# Patient Record
Sex: Female | Born: 1945
Health system: Southern US, Community
[De-identification: ages and names within clinical notes are randomized; demographics above are authoritative.]

## PROBLEM LIST (undated history)

## (undated) DIAGNOSIS — M797 Fibromyalgia: Secondary | ICD-10-CM

## (undated) DIAGNOSIS — I10 Essential (primary) hypertension: Secondary | ICD-10-CM

## (undated) DIAGNOSIS — G8929 Other chronic pain: Secondary | ICD-10-CM

## (undated) DIAGNOSIS — D369 Benign neoplasm, unspecified site: Secondary | ICD-10-CM

## (undated) DIAGNOSIS — R32 Unspecified urinary incontinence: Secondary | ICD-10-CM

## (undated) DIAGNOSIS — M1990 Unspecified osteoarthritis, unspecified site: Secondary | ICD-10-CM

## (undated) DIAGNOSIS — W19XXXA Unspecified fall, initial encounter: Secondary | ICD-10-CM

## (undated) DIAGNOSIS — E119 Type 2 diabetes mellitus without complications: Secondary | ICD-10-CM

## (undated) DIAGNOSIS — H269 Unspecified cataract: Secondary | ICD-10-CM

## (undated) DIAGNOSIS — G629 Polyneuropathy, unspecified: Secondary | ICD-10-CM

## (undated) DIAGNOSIS — H9319 Tinnitus, unspecified ear: Secondary | ICD-10-CM

## (undated) HISTORY — DX: Other chronic pain: G89.29

## (undated) HISTORY — DX: Unspecified osteoarthritis, unspecified site: M19.90

## (undated) HISTORY — DX: Polyneuropathy, unspecified: G62.9

## (undated) HISTORY — DX: Fibromyalgia: M79.7

## (undated) HISTORY — DX: Benign neoplasm, unspecified site: D36.9

## (undated) HISTORY — PX: BUNIONECTOMY: SHX129

## (undated) HISTORY — DX: Tinnitus, unspecified ear: H93.19

## (undated) HISTORY — PX: REPLACEMENT TOTAL KNEE: SUR1224

## (undated) HISTORY — DX: Unspecified fall, initial encounter: W19.XXXA

## (undated) HISTORY — PX: TUBAL LIGATION: SHX77

## (undated) HISTORY — DX: Unspecified cataract: H26.9

## (undated) HISTORY — PX: VAGINAL HYSTERECTOMY: SUR661

## (undated) HISTORY — DX: Unspecified urinary incontinence: R32

## (undated) HISTORY — PX: REDUCTION MAMMAPLASTY: SUR839

## (undated) HISTORY — PX: BREAST REDUCTION SURGERY: SHX8

## (undated) HISTORY — DX: Type 2 diabetes mellitus without complications: E11.9

## (undated) HISTORY — DX: Essential (primary) hypertension: I10

---

## 1998-09-20 ENCOUNTER — Encounter: Payer: Self-pay | Admitting: Obstetrics & Gynecology

## 1998-09-20 ENCOUNTER — Ambulatory Visit (HOSPITAL_COMMUNITY): Admission: RE | Admit: 1998-09-20 | Discharge: 1998-09-20 | Payer: Self-pay | Admitting: Obstetrics & Gynecology

## 1999-08-02 ENCOUNTER — Encounter: Payer: Self-pay | Admitting: Cardiology

## 1999-08-02 ENCOUNTER — Encounter: Admission: RE | Admit: 1999-08-02 | Discharge: 1999-08-02 | Payer: Self-pay | Admitting: Cardiology

## 1999-09-11 ENCOUNTER — Other Ambulatory Visit: Admission: RE | Admit: 1999-09-11 | Discharge: 1999-09-11 | Payer: Self-pay | Admitting: Specialist

## 1999-11-20 ENCOUNTER — Ambulatory Visit (HOSPITAL_BASED_OUTPATIENT_CLINIC_OR_DEPARTMENT_OTHER): Admission: RE | Admit: 1999-11-20 | Discharge: 1999-11-20 | Payer: Self-pay | Admitting: Orthopedic Surgery

## 1999-12-28 ENCOUNTER — Other Ambulatory Visit: Admission: RE | Admit: 1999-12-28 | Discharge: 1999-12-28 | Payer: Self-pay | Admitting: Obstetrics and Gynecology

## 2001-03-06 ENCOUNTER — Ambulatory Visit (HOSPITAL_COMMUNITY): Admission: RE | Admit: 2001-03-06 | Discharge: 2001-03-06 | Payer: Self-pay | Admitting: Obstetrics and Gynecology

## 2001-03-06 ENCOUNTER — Encounter: Payer: Self-pay | Admitting: Obstetrics and Gynecology

## 2001-03-20 ENCOUNTER — Encounter: Admission: RE | Admit: 2001-03-20 | Discharge: 2001-03-20 | Payer: Self-pay | Admitting: Cardiology

## 2001-03-20 ENCOUNTER — Encounter: Payer: Self-pay | Admitting: Cardiology

## 2001-06-14 ENCOUNTER — Emergency Department (HOSPITAL_COMMUNITY): Admission: EM | Admit: 2001-06-14 | Discharge: 2001-06-14 | Payer: Self-pay | Admitting: Emergency Medicine

## 2001-06-14 ENCOUNTER — Encounter: Payer: Self-pay | Admitting: Emergency Medicine

## 2001-07-18 ENCOUNTER — Emergency Department (HOSPITAL_COMMUNITY): Admission: EM | Admit: 2001-07-18 | Discharge: 2001-07-18 | Payer: Self-pay | Admitting: Emergency Medicine

## 2001-07-23 ENCOUNTER — Ambulatory Visit (HOSPITAL_COMMUNITY): Admission: RE | Admit: 2001-07-23 | Discharge: 2001-07-23 | Payer: Self-pay | Admitting: Orthopedic Surgery

## 2001-07-23 ENCOUNTER — Encounter: Payer: Self-pay | Admitting: Orthopedic Surgery

## 2001-09-05 ENCOUNTER — Encounter: Admission: RE | Admit: 2001-09-05 | Discharge: 2001-12-04 | Payer: Self-pay | Admitting: Orthopedic Surgery

## 2001-11-26 ENCOUNTER — Encounter: Payer: Self-pay | Admitting: Orthopedic Surgery

## 2001-12-01 ENCOUNTER — Inpatient Hospital Stay (HOSPITAL_COMMUNITY): Admission: RE | Admit: 2001-12-01 | Discharge: 2001-12-03 | Payer: Self-pay | Admitting: Orthopedic Surgery

## 2001-12-07 ENCOUNTER — Emergency Department (HOSPITAL_COMMUNITY): Admission: EM | Admit: 2001-12-07 | Discharge: 2001-12-07 | Payer: Self-pay | Admitting: *Deleted

## 2002-06-17 ENCOUNTER — Encounter: Admission: RE | Admit: 2002-06-17 | Discharge: 2002-06-17 | Payer: Self-pay | Admitting: Orthopedic Surgery

## 2002-06-17 ENCOUNTER — Encounter: Payer: Self-pay | Admitting: Orthopedic Surgery

## 2002-07-20 ENCOUNTER — Inpatient Hospital Stay (HOSPITAL_COMMUNITY): Admission: RE | Admit: 2002-07-20 | Discharge: 2002-07-23 | Payer: Self-pay | Admitting: Orthopedic Surgery

## 2002-09-03 ENCOUNTER — Encounter: Admission: RE | Admit: 2002-09-03 | Discharge: 2002-10-06 | Payer: Self-pay | Admitting: Orthopedic Surgery

## 2002-12-01 ENCOUNTER — Encounter: Payer: Self-pay | Admitting: *Deleted

## 2002-12-01 ENCOUNTER — Ambulatory Visit (HOSPITAL_COMMUNITY): Admission: RE | Admit: 2002-12-01 | Discharge: 2002-12-01 | Payer: Self-pay | Admitting: *Deleted

## 2002-12-18 ENCOUNTER — Encounter: Payer: Self-pay | Admitting: Obstetrics and Gynecology

## 2002-12-18 ENCOUNTER — Ambulatory Visit (HOSPITAL_COMMUNITY): Admission: RE | Admit: 2002-12-18 | Discharge: 2002-12-18 | Payer: Self-pay | Admitting: Obstetrics and Gynecology

## 2002-12-21 ENCOUNTER — Other Ambulatory Visit: Admission: RE | Admit: 2002-12-21 | Discharge: 2002-12-21 | Payer: Self-pay | Admitting: Obstetrics and Gynecology

## 2004-02-14 ENCOUNTER — Encounter: Admission: RE | Admit: 2004-02-14 | Discharge: 2004-02-14 | Payer: Self-pay | Admitting: Cardiology

## 2004-06-09 ENCOUNTER — Ambulatory Visit (HOSPITAL_COMMUNITY): Admission: RE | Admit: 2004-06-09 | Discharge: 2004-06-09 | Payer: Self-pay | Admitting: Obstetrics and Gynecology

## 2004-07-19 ENCOUNTER — Other Ambulatory Visit: Admission: RE | Admit: 2004-07-19 | Discharge: 2004-07-19 | Payer: Self-pay | Admitting: Obstetrics and Gynecology

## 2004-11-19 ENCOUNTER — Emergency Department (HOSPITAL_COMMUNITY): Admission: EM | Admit: 2004-11-19 | Discharge: 2004-11-19 | Payer: Self-pay | Admitting: Emergency Medicine

## 2004-12-21 ENCOUNTER — Ambulatory Visit: Payer: Self-pay | Admitting: Gastroenterology

## 2005-04-17 ENCOUNTER — Ambulatory Visit (HOSPITAL_COMMUNITY): Admission: RE | Admit: 2005-04-17 | Discharge: 2005-04-17 | Payer: Self-pay | Admitting: Cardiology

## 2005-06-07 ENCOUNTER — Inpatient Hospital Stay (HOSPITAL_BASED_OUTPATIENT_CLINIC_OR_DEPARTMENT_OTHER): Admission: RE | Admit: 2005-06-07 | Discharge: 2005-06-07 | Payer: Self-pay | Admitting: Cardiology

## 2005-06-21 ENCOUNTER — Ambulatory Visit (HOSPITAL_COMMUNITY): Admission: RE | Admit: 2005-06-21 | Discharge: 2005-06-21 | Payer: Self-pay | Admitting: Obstetrics and Gynecology

## 2005-07-12 ENCOUNTER — Encounter: Admission: RE | Admit: 2005-07-12 | Discharge: 2005-07-12 | Payer: Self-pay | Admitting: Obstetrics and Gynecology

## 2005-08-01 ENCOUNTER — Other Ambulatory Visit: Admission: RE | Admit: 2005-08-01 | Discharge: 2005-08-01 | Payer: Self-pay | Admitting: Obstetrics and Gynecology

## 2005-08-09 ENCOUNTER — Encounter: Admission: RE | Admit: 2005-08-09 | Discharge: 2005-08-09 | Payer: Self-pay | Admitting: Obstetrics and Gynecology

## 2006-03-05 ENCOUNTER — Encounter: Admission: RE | Admit: 2006-03-05 | Discharge: 2006-03-05 | Payer: Self-pay | Admitting: Obstetrics and Gynecology

## 2006-04-18 ENCOUNTER — Encounter: Admission: RE | Admit: 2006-04-18 | Discharge: 2006-04-18 | Payer: Self-pay | Admitting: Cardiology

## 2006-06-23 ENCOUNTER — Emergency Department (HOSPITAL_COMMUNITY): Admission: EM | Admit: 2006-06-23 | Discharge: 2006-06-23 | Payer: Self-pay | Admitting: Emergency Medicine

## 2007-03-24 ENCOUNTER — Encounter: Admission: RE | Admit: 2007-03-24 | Discharge: 2007-03-24 | Payer: Self-pay | Admitting: Cardiology

## 2007-09-30 ENCOUNTER — Encounter: Admission: RE | Admit: 2007-09-30 | Discharge: 2007-09-30 | Payer: Self-pay | Admitting: Cardiology

## 2008-05-21 ENCOUNTER — Emergency Department (HOSPITAL_COMMUNITY): Admission: EM | Admit: 2008-05-21 | Discharge: 2008-05-22 | Payer: Self-pay | Admitting: Emergency Medicine

## 2008-10-06 ENCOUNTER — Encounter: Admission: RE | Admit: 2008-10-06 | Discharge: 2008-10-06 | Payer: Self-pay | Admitting: Cardiology

## 2009-07-29 ENCOUNTER — Encounter: Admission: RE | Admit: 2009-07-29 | Discharge: 2009-07-29 | Payer: Self-pay | Admitting: Cardiology

## 2009-08-10 ENCOUNTER — Ambulatory Visit (HOSPITAL_COMMUNITY): Admission: RE | Admit: 2009-08-10 | Discharge: 2009-08-10 | Payer: Self-pay | Admitting: Cardiology

## 2009-08-10 ENCOUNTER — Ambulatory Visit: Payer: Self-pay | Admitting: Vascular Surgery

## 2009-08-10 ENCOUNTER — Encounter (INDEPENDENT_AMBULATORY_CARE_PROVIDER_SITE_OTHER): Payer: Self-pay | Admitting: Cardiology

## 2010-03-29 ENCOUNTER — Encounter: Admission: RE | Admit: 2010-03-29 | Discharge: 2010-03-29 | Payer: Self-pay | Admitting: Cardiology

## 2010-06-03 ENCOUNTER — Encounter: Payer: Self-pay | Admitting: Cardiology

## 2010-06-03 ENCOUNTER — Encounter: Payer: Self-pay | Admitting: Obstetrics and Gynecology

## 2010-06-04 ENCOUNTER — Encounter: Payer: Self-pay | Admitting: Cardiology

## 2010-09-29 NOTE — Op Note (Signed)
NAME:  Carvin, Jacquelinne O                           ACCOUNT NO.:  [id number]   MEDICAL RECORD NO.:  [id number]                   PATIENT TYPE:  INP   LOCATION:  5003                                 FACILITY:  [facility]   PHYSICIAN:  John L. Rendall III, M.D.           DATE OF BIRTH:  [birth year]   DATE OF PROCEDURE:  07/20/2002  DATE OF DISCHARGE:                                 OPERATIVE REPORT   PREOPERATIVE DIAGNOSIS:  Osteoarthritis left knee.   PROCEDURE:  Left LCS total knee replacement.   POSTOPERATIVE DIAGNOSIS:  Osteoarthritis left knee.   SURGEON:  John L. Rendall, M.D.   ASSISTANT:  Madilyn Fireman, P.A.-C.   ANESTHESIA:  General.   PATHOLOGY:  The patient has osteoarthritis that has failed conservative  management including arthroscopic debridement.  She has a severe defect  medial femoral condyle and patellofemoral articulation.   DESCRIPTION OF PROCEDURE:  Under general anesthesia, the left leg was  prepared with Duraprep and draped as a sterile field. The leg is wrapped out  with an Esmarch and the tourniquet is used at 350 mm.  A midline incision is  made. The patella is everted.  The femur is sized to the standard plus using  the first tibial guide.  A proximal tibial resection is carried out. Using  the first femoral guide an intercondylar drill hole is placed. Using the  second guide, flare of the distal femur is resected anteriorly and  posteriorly with a balanced 15 mm flexion gap.  Intermedullary guide is then  used and a balanced extension gap at 15 mm is obtained.  Recessing guide is  then used.  Lamina spreader is then used and remnants of the cruciates and  the menisci are resected.  The KB is then sized to the #3.  Center peg hole  with keel is placed.  Trial of #3 tibia 15 bearing and standard plus femur  reveals excellent fit and alignment, but slight laxity.  17.5 bearing is  then inserted and better fit and stability are obtained. Patella is  osteotomized.   Three peg hole patella is inserted for trial and the knee has  excellent range of motion and stability.  Permanent components are then  obtained while bony surfaces are prepared with pulse irrigation. A  synovectomy is carried out. All components are cemented in place.  Once  cement has hardened, excess is removed and the tourniquet is let down at 58  minutes. The knee is then cauterized for bleeding vessels. A medium Hemovac  drain is inserted. It is then closed in layers with #1 Tycron, 0 and 2-0  Vicryl, and skin clips.  Operative time approximately 1 hour 20 minutes. The  patient tolerated the procedure well and returned to the recovery room in  good condition.  John L. Dorothyann Gibbs, M.D.    Renato Gails  D:  07/20/2002  T:  07/20/2002  Job:  [id number]

## 2010-09-29 NOTE — H&P (Signed)
NAME:  Michelle Villarreal, Michelle Villarreal                           ACCOUNT NO.:  [id number]   MEDICAL RECORD NO.:  [id number]                   PATIENT TYPE:  INP   LOCATION:                                       FACILITY:  [facility]   PHYSICIAN:  John L. Rendall, M.D.               DATE OF BIRTH:  [birth year]   DATE OF ADMISSION:  07/20/2002  DATE OF DISCHARGE:                                HISTORY & PHYSICAL   CHIEF COMPLAINT:  Left knee pain.   HISTORY OF PRESENT ILLNESS:  The patient is a 65 year old black female with  a history of bilateral knee pain with chondromalacia.  The patient had a  right total knee arthroplasty in July, 2003 initially with excellent results  but over the last several months since she has returned to work she has been  having a significant amount of discomfort in her right total knee with any  type of weight bearing activity.  The patient has also been having  progressive worsening of her left knee pain.  She describes it as a deep  burning aching sensation in the knees with weight bearing with sharp  shooting pains with awkward movements.  She does have an aching sensation  that does radiate down the legs in combination with her neuropathy in the  glove pattern fashion of bilateral lower extremities.  She does note some  swelling in the knee.  She has had some locking and giving out of the knee.  She does notice some popping and grinding.  She does have significant night  pain.  She is currently using a cane to assist in ambulation.   ALLERGIES:  ULTRAM, drug intolerances, all p.o. and IV pain medicines  causing itching.   The patient did have an unusual reaction to the Arixtra seven days  postoperatively.  She had some superficial bleeding from varicosities in her  lower extremities and this was corrected shortly after discontinuing the  Arixtra.   MEDICATIONS:  1. Neurontin 300 mg p.o. b.i.d.  2. Vitamin E 1,200 units daily.  3. Vitamin C 500 mg daily.  4. Darvocet  p.r.n.  5. Phenergan 25 mg p.o. every four hours to six hours p.r.n.  6. Benadryl 50 mg p.o. every six hours p.r.n.  7. Fosamax 70 mg p.o. every week on Fridays.   PAST MEDICAL HISTORY:  1. Recurrent and chronic bronchitis.  2. 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  3. History of lower extremity deep venous thrombosis.  4. Hiatal hernia.   PAST SURGICAL HISTORY:  1. Venous stripping in bilateral lower extremities.  2. Bilateral bunionectomies.  3. Bilateral total knee arthroscopies.  4. Hysterectomy in 1982.  5. Bilateral breast reduction in 1985.  6. Exploratory laparotomy with ovariectomy in 1989.  7. Bilateral knee arthroscopies in 2001 and 2003.  8. Right total knee arthroplasty.   Patient indicates the only complications  that she has had with any of the  above mentioned was with the total knee and Arixtra with superficial  bleeding in the lower extremity which corrected itself briskly after  discontinuing the Arixtra.   SOCIAL HISTORY:  The patient is a healthy appearing, well-developed 55-year-  old black female.  Denies any history of smoking.  She does have occasional  alcoholic beverage.  She is single, has one grown child.  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   PRIMARY CARE PHYSICIAN:  Dr. Donia Guiles, [phone].   FAMILY HISTORY:  Mother is deceased from ovarian cancer, father is deceased  from peritonitis due to ruptured appendix.  Patient has one brother with a  history of diabetes and one sister with obesity, hypertension, and seizures.   REVIEW OF SYSTEMS:  Positive for current upper respiratory cold for which  she indicates she has no fevers or chills, shortness of breath.  The patient  does have lower partial dentures.  She does wear glasses.  She does have  occasional problems with nausea and itching related to the pain medicine she  is now taking because of her knees.  She does  have the peripheral neuropathy  in a glove pattern type of lower extremities with burning.   PHYSICAL EXAMINATION:  VITALS:  Blood pressure is 138/90, respirations 16,  pulse is 76 and regular, weight is 213 pounds, height is 5 feet 11.  GENERAL:  This is a healthy appearing well-developed, large black female.  She ambulates with a moderate limp.  She appears to be in obvious discomfort  with ambulation.  She does have a cane in her right hand.  She is able to  get herself on and off of the exam table without difficulty.  HEENT:  Head was normocephalic, atraumatic, nontender over maxillary or  frontal sinuses.  Pupils:  Equal, round, and reactive, accommodating  delayed, extraocular movements intact.  Sclerae is nonicteric.  Conjunctivae  is pink and moist.  External ears were without deformities, canals patent,  TM's pearly gray and intact.  Gross hearing is intact.  Oral buccal mucosa  was pink and moist and without lesions.  Dentition was in fair repair,  partial plate in place.  Patient was able to swallow without any difficulty.  NECK:  Supple, no palpable lymphadenopathy.  Thyroid gland region is  nontender.  Patient has full range of motion of her cervical spine without  any difficulty or tenderness.  HEART:  Regular rate and rhythm, S1 and S2 were auscultated, no murmurs,  rubs or gallops noted.  LUNGS:  Clear and equal bilaterally.  No wheezes, rales, rhonchi or rubs  noted.  ABDOMEN:  Soft, nontender, bowel sounds were normoactive throughout.  She  had no hepatosplenomegaly.  The CVA region was nontender to percussion.  EXTREMITIES:  Upper extremities were symmetrically sized and shaped.  She  had excellent range of motion of both her shoulders, elbows and wrists.  Motor strength was 5/5.  LOWER EXTREMITIES:  Right and left hip had full range of motion without any  mechanical symptoms or deficits.  Right knee was a well-healed, midline surgical incision, it was small and along  the femoral component aspect.  It  was very tender throughout.  She had full extension and flexion back to  about 105 degrees but it was uncomfortable in extremes.  There was no  significant instability.  The calf was nontender.  The left  knee was round,  full appearing, had well-healed arthroscopic surgical ports, no palpable  effusion.  She was tender along the medial and lateral joint lines.  She had  a moderate amount of crepitus on the patella with full extension and flexion  back to about 105 degrees, no significant instability medially or laterally.  The calf was nontender.  Ankles were symmetric both of the dorsi and plantar  flexion.  PERIPHERAL VASCULATURE:  Carotid pulses were 2+, no bruits.  Radial pulses  were 2+.  Dorsalis pedis and posterior tibial pulses were 1+.  She had trace  lower extremity edema which was nonpitting.  No significant varicosities or  venous stasis changes.  NEURO:  The patient was conscious, alert, and appropriate, held decent  conversation with examiner.  Cranial nerves II through XII were grossly  intact.  Patient had light touch sensation from head to toe.  Tendon  reflexes were symmetrical right to left.  Patient had no gross neurologic  defects.   IMPRESSION:  1. End-stage osteoarthritis, left knee.  2. Painful right total knee arthroplasty.  3. History of frequent bronchitis.  4. Hiatal hernia.  5. History of peripheral neuropathy.  6. History of lower extremity deep venous thrombosis.   PLAN:  The patient will be admitted to [facility] on July 20, 2002  under the care of Dr. Jonny Ruiz L. Rendall, M.D.  The patient will undergo all  routine laboratory and tests prior to having a left total knee arthroplasty  performed.     Jamelle Rushing, P.A.                      John L. Priscille Kluver, M.D.    RWK/MEDQ  D:  07/09/2002  T:  07/09/2002  Job:  [id number]

## 2010-09-29 NOTE — Op Note (Signed)
[facility]  Patient:    Michelle Villarreal, Michelle Villarreal Visit Number: [id number] MRN: [id number]          Service Type: SUR Location: 5000 5011 [id number] Attending Physician:  Carolan Shiver Ii Proc. Date: 12/01/01 Admit Date:  12/01/2001 Discharge Date: 12/03/2001                             Operative Report  PREOPERATIVE DIAGNOSIS:  Osteoarthritis right knee.  POSTOPERATIVE DIAGNOSIS:  Osteoarthritis right knee.  PROCEDURE PERFORMED:  Right LCS total knee replacement.  SURGEON:  John L. Dorothyann Gibbs, M.D.  ASSISTANTJerolyn Shin. Tresa Res, M.D.  ANESTHESIA:  General.  INDICATION:  The patient has had within the last four months, knee arthroscopy at which point she was found to have peeling highland cartilage over much of the articular surface of her knee.  These findings were confirmed again today with an erosive aggressive osteoarthritis with significant scarring of the suprapatellar pouch and synovium.  DESCRIPTION OF PROCEDURE:  Under general anesthesia, the right leg is prepared with Duraprep and draped as a sterile field.  It was wrapped out with an Esmarch and a sterile tourniquet is used at 350 mmHg.  A midline incision is made.  The patella is everted.  The femur is sized to a standard plus.  Using the first guide, the proximal tibial resection is carried out.  The PCL is not preserved.  Using the first femoral guide, an intercondylar drill hole was placed.  Using the second guide, the anterior and posterior flare of the distal femur are resected, but it is necessary to balance the flexion gap and in so doing, a complete medial stripping of the periosteal sleeve around the medial tibia was required, and this balanced the flexion gap at 17.5 in spite of a relatively small proximal tibial resection.  At this point, the intermedullary guide is used and the distal femoral cut is made where a balanced 17.5 extension gap is created.  A recessing guide is  then used.  A laminar spreader is then inserted and remnants of the cruciate and menisci are resected along with peeling of the spurs off the back of the femoral condyle medially and laterally.  Once this was completed, the proximal tibia is exposed.  It is sized to a #4.  Center peg holes with fins is made.  Trial reduction of a #4 tibia, 17.5 barring, standard plus femur reveals excellent fit, alignment, and stability.  The patella is osteotomized and a three peg hole trial patella is inserted and again excellent fit alignment and stability.  Permanent components were then obtained while bony surfaces are prepared with pulse irrigation.  Drill holes were placed in the medial tibia where there was some sclerotic bone.  At this point, having irrigated with antibiotic solution, the tibia is cemented in place.  Finger pressurizing the center peg hole.  Attention was then turned to the femur with plastic barring in place, the femoral component is inserted.  The knee is then brought into extension, but allowed to rest in about a 30 degree flexed position while the patella is clamped and done.  Once the cement is hardened, excess cement is removed.  The tourniquet is let down at 50 minutes.  Excess cement is then removed and multiple small vessels are cauterized.  The knee is then closed in layers over a medium Hemovac drain.  It is closed with #1 Tycron,  0, and 2-0 Vicryl, and skin clips.  Operative time approximately 1 hour and 10 minutes. The patient tolerated the procedure well and returned to the recovery room in good condition. Attending Physician:  Carolan Shiver Ii DD:  12/01/01 TD:  12/04/01 Job: [id number]

## 2010-09-29 NOTE — Discharge Summary (Signed)
NAME:  Michelle Villarreal, Michelle Villarreal                           ACCOUNT NO.:  [id number]   MEDICAL RECORD NO.:  [id number]                   PATIENT TYPE:  INP   LOCATION:  5003                                 FACILITY:  [facility]   PHYSICIAN:  John L. Rendall, M.D.               DATE OF BIRTH:  May 10, 1946   DATE OF ADMISSION:  07/20/2002  DATE OF DISCHARGE:  07/23/2002                                 DISCHARGE SUMMARY   ADMITTING DIAGNOSES:  1. End-stage osteoarthritis, left knee.  2. Painful right total knee arthroplasty.  3. History of frequent bronchitis.  4. Hiatal hernia.  5. History of peripheral neuropathy.  6. History of lower extremity deep vein thrombosis.   DISCHARGE DIAGNOSES:  1. Status post left total knee arthroplasty.  2. Painful right total knee arthroplasty.  3. History of frequent bronchitis.  4. Hiatal hernia.  5. History of peripheral neuropathy.  6. History of lower extremity deep vein thrombosis.   HISTORY OF PRESENT ILLNESS:  A 65 year old black female with a history of  bilateral knee pain with chondromalacia.  She had a total knee arthroplasty  in July of 2003 which she initially had excellent results with.  However,  over the last several months since returning to work she has had a  significant amount of discomfort in her right total knee with any  weightbearing activity.  She has also been experiencing progressively  worsening left knee pain.  Her pain is described as deep burning and aching  sensation in the knees with weightbearing with sharp shooting pains with  awkward movements.  The aching sensation does radiate down her leg and in  combination with her neuropathy in a glove pattern fashion of bilateral  lower extremities.  Her left knee does lock and give away and she notices  popping and guarding.  The pain is significant at night.  She uses a cane to  ambulate.  The patient admitted to [facility] on July 20, 2002, to  undergo left TKA by Dr.  Priscille Kluver.   ALLERGIES:  ULTRAM.  Drug intolerances, ALL P.Villarreal. AND IV PAIN MEDS cause  itching.  __________ caused some superficial bleeding from varicosities in  her lower extremities during her last hospital stay.   MEDICATIONS:  1. Neurontin 300 mg p.Villarreal. b.i.d.  2. Vitamin E 1200 units daily.  3. Vitamin C 500 mg daily.  4. Darvocet p.r.n.  5. Phenergan 25 mg p.Villarreal. every 4-6 hours p.r.n.  6. Benadryl 50 mg p.Villarreal. q.6h. p.r.n.  7. Fosamax 7 mg p.Villarreal. every week on Fridays.   SURGICAL PROCEDURE:  The patient was taken on July 20, 2002, to OR by Dr.  Erasmo Leventhal, assisted by Rexene Edison, P.A.C.  Under general anesthesia left  total knee replacement was done.  Operative time was approximately one hour  and 20 minutes.  The patient tolerated the procedure well and returned to  recovery in good condition.   CONSULTATIONS:  The following consults were obtained while the patient was  hospitalized:  PT, IT, pharmacy and case management.   HOSPITAL COURSE:  The patient remained stable in good condition following  surgery.  She remained afebrile throughout her stay.  She did develop  postoperative anemia secondary to surgery.  She received two units of packed  red blood cells and her H&H on discharge was 10.4/30.3.  She had no symptoms  of anemia.  On postoperative day three she developed some swelling and calf  pain in her left lower leg.  Doppler was obtained to rule out DVT and was  negative for DVT, Baker's cyst or SVT.  The patient did well with physical  therapy throughout her stay and was discharged home with [facility] for home  PT.   LABS:  Routine labs on admission CBC 4.0, hemoglobin 13.2, hematocrit 38.4,  platelets 150.  Next, coags on admission PT 13.2, INR 0.9, PTT 29.  Next,  routine chemistries on admission, sodium 139, potassium 4.1, chloride 106,  bicarb 29, glucose 86, BUN 5, creatinine 2.5.  Next, urinalysis on admission  was negative.  Preoperative chest x-ray shows no active  disease.   DISCHARGE MEDICATIONS:  The patient was placed on the following meds:  1. Coumadin 7.5 mg p.Villarreal. daily then per [place].  2. Percocet 5 mg 1-2 tabs by mouth q.4-6h. as needed for pain.  3. Atarax 25 mg one tab by mouth q.8h. as needed for itching.  4. Oxycodone IR 50 mg one tab p.Villarreal. q.4-6h. as needed for breakthrough pain.  5. Neurontin 300 mg p.Villarreal. b.i.d.  6. Vitamin E 1200 units q.d.  7. Vitamin C 500 mg daily.  8. Phenergan 25 mg p.Villarreal. q.4-6h. p.r.n.  9. Fosamax 70 mg p.Villarreal. q week on Fridays.   ACTIVITY:  The patient is on weightbearing as tolerated with a walker.  Physical therapy with Genevieve Norlander.   DIET:  No restrictions.   CONDITION ON DISCHARGE:  The patient was in good stable condition upon  discharge.   FOLLOWUP:  The patient was to follow up with Dr. Priscille Kluver approximately 10  days after discharge.  She should call office for appointment at [phone]  for followup and staple removal.   SPECIAL CARE:  The patient is to have her Coumadin monitored and dosed by  [place].  She is to use her CPM 0-90 six to eight hours a day and increase  by 10 degrees daily.   WOUND CARE:  1. Keep wound clean and dry.  2. The patient is to call Dr. Priscille Kluver if she develops any signs of     infection:  Fever greater than 101.5, chills,     redness, swelling, foul smelling drainage or pain uncontrolled with pain     medicines.  3. The patient may shower after two days if no drainage from the wound.  4. She should change her dressing daily.     Richardean Canal, P.A.                       John L. Priscille Kluver, M.D.    GC/MEDQ  D:  09/22/2002  T:  09/23/2002  Job:  [id number]   cc:   Jonny Ruiz L. Rendall III, M.D.  [address] [place]  [place] [address]  Fax: [phone]

## 2010-09-29 NOTE — Op Note (Signed)
[place]  Patient:    Michelle Villarreal, Michelle Villarreal                        MRN: [id number] Proc. Date: 11/20/99 Adm. Date:  [id number] Attending:  Alinda Deem                           Operative Report  PREOPERATIVE DIAGNOSIS:  Cartilage tears of both knees.  POSTOPERATIVE DIAGNOSIS:  On the right, the patient had partial medial and lateral meniscal tears, area of grade 4 chondromalacia of the medial femoral condyle over a long strip with large cartilaginous loose bodies.  On the medial side, the strip was 1 x 2 cm and it was a full thickness cartilage with delamination.  On the lateral side, she had chondromalacia of the lateral tibial condyle and a partial lateral meniscal tear as well.  The chondromalacia of the lateral femoral condyle was grade 3 to the patella, grade 3 to the trochlea and grade 2 of the left knee.  She had chondromalacia of the patella, grade 3 lateral tibial condyle grade 3 and a lateral meniscal tear.  PROCEDURE:  On the right, the patient had a partial arthroscopic medial and lateral meniscectomy with removal of large cartilaginous loose bodies, debridement of chondromalacia of the medial and lateral femoral condyle, as well as the patella.  On the left side, she had a partial lateral meniscectomy, and debridement of chondromalacia of the lateral tibial condyle and patella.  SURGEON:  Alinda Deem, M.D.  FIRST ASSISTANT:  Dorthula Matas, P.A.-C.  ANESTHESIA:  General LMA.  ESTIMATED BLOOD LOSS:  Minimal.  FLUID REPLACEMENT:  A liter of crystalloid.  DRAINS PLACED:  None.  TOURNIQUET TIME:  None.  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  On the right side, she has had persistent effusions over the last couple  of weeks, and has failed conservative treatment with anti-inflammatory medicines, physical therapy, Cortisone injections, and most recently, _____ shot. She has reaccumulated fluid each time and is having a great deal of difficulty ambulating and doing her job as a Engineer, civil (consulting).  Because of this, she desires arthroscopic evaluation of the right and left knee.  The left knee has a similar problem, but to a much lesser degree, but she definitely has some lateral joint line pain consistent with a cartilage tear.  DESCRIPTION OF PROCEDURE:  The patient was identified by arm band and taken to the operating room at [facility].  Appropriate anesthetic monitors were attached and general LMA anesthesia induced with the patient in the supine position.  Lateral post were applied to both sides of the table and both lower extremities prepped and draped in the usual sterile fashion from the ankles to the mid thigh.  Beginning on the right side, the inferomedial and inferolateral parapatellar regions were infiltrated with 1 cc to 2 cc of 0.5% Marcaine and epinephrine solution, allowing introduction of the arthroscope to the lateral portal, and the outflow through the medial portal. The patient had a 4+ joint effusion with normal appearing joint fluid with distal articular cartilage noted coming out the outflow.  Diagnostic arthroscopy revealed chondromalacia of the patella and trochlea, grade 3  which was debrided.  Moving to the medial compartment, the medial meniscus had longitudinal tears which were debrided and a long 1 x 2 cm strip of grade 4 cartilage loss where it had delaminated almost off of the bone.  This had flap tears associated with it, and these were debrided back to stable margins. The ACL and the PCL were intact.  On the lateral side, extensive fraying of the lateral meniscus was noted as well as a longitudinal split tear, and this was also debrided with the 4.2 mm barracuda sucker  shaver, as well as the basket biter.  The lateral femoral condyle had some grade 3 flap tears which also required debridement.  Moving back up in the patch, we identified two large cartilaginous loose bodies which were removed through the inferomedial portal which had to be enlarged to a centimeter in size to get them out.  The knee was then continuously washed with normal saline solution.  The arthroscopic instruments removed.  The medial portal closed with a single 4-0 nylon horizontal mattress suture.  We then directed our attention to the left knee which had similar portals made after infiltration with a Marcaine and epinephrine solution.  Diagnostic arthroscopy revealed grade 3 chondromalacia of the patellar apex which was also debrided.  The trochlea appeared to be in good condition as did the articular meniscal cartilages medially and the cruciate ligaments.  On the lateral side, the patient had a pair of big tears of the lateral meniscus which were debrided back to a stable margin.  Grade 2 chondromalacia of the lateral tibial condyle was identified, debrided back as well to stable margins over a 1 x 1 cm area.  The knee was washed out with normal saline solution as we had on the other side.  The gutters were cleared.  The scope was taken medial and lateral of the PCL, removing small bits of articular cartilage found in the joint fluid.  This knee was likewise washed out with normal saline solution.  The arthroscopic instruments removed and another dressing of Xeroform followed by a dressing, sponges, Webril, and an Ace wrap applied to the left knee, and also to the right knee.  The cartilage damage on the left is mild.  On the right side, moderate to severe.  The patient was then awakened and taken to the recovery room without difficulty after instilling 10 cc of Marcaine and epinephrine solution into each knee. DD:  11/20/99 TD:  11/20/99 Job: [id number]

## 2010-09-29 NOTE — Cardiovascular Report (Signed)
NAME:  Michelle Villarreal, Michelle Villarreal                 ACCOUNT NO.:  [id number]   MEDICAL RECORD NO.:  [id number]          PATIENT TYPE:  OIB   LOCATION:  1962                         FACILITY:  [facility]   PHYSICIAN:  Mohan N. Sharyn Lull, M.D. DATE OF BIRTH:  1946-02-17   DATE OF PROCEDURE:  06/07/2005  DATE OF DISCHARGE:  06/07/2005                              CARDIAC CATHETERIZATION   PROCEDURE:  1.  Left cardiac catheterization.  2.  __________ coronary angiograph.  3.  Left ventriculography via right groin using Judkins technique.   INDICATIONS FOR PROCEDURE:  Ms. Belleau is a 65 year old black female with past  medical history significant for hypertension, morbid obesity, degenerative  joint disease.  Complains of vague left-sided chest pain grade 6/10  localized, associated with occasional palpitation off and on.  Patient  states chest pain relieved with Tylenol XL, but makes her feel weak and  lethargic.  Patient also gives history of exertional dyspnea with minimal  exertion.  Denies any PND, orthopnea, leg swelling.  Patient underwent  Persantine Myoview on April 17, 2005 which showed mild anterior wall  ischemia with EF of 61%.  Patient is referred for left catheterization.   PAST MEDICAL HISTORY:  As above.   PAST SURGICAL HISTORY:  1.  She had bilateral knee replacement many years ago.  2.  Had left leg vein stripping many years ago.  3.  Had bilateral breast reduction surgery in 1984.  4.  Hysterectomy in 1982.  5.  Exploratory laparotomy in 1989.  6.  Right oophorectomy many years ago.   SOCIAL HISTORY:  She is single, has one child.  No history of smoking.  Drinks beer and wine occasionally socially.  She worked as Astronomer.  Retired in  2003.   FAMILY HISTORY:  Father died of ruptured appendix at the age of 22.  Mother  died of ovarian cancer.  One brother had cancer.  One sister had  hypertension, seizure disorder and she is obese.   HOME MEDICATIONS:  1.  Toprol XL 25 mg p.o. daily  which she recently stopped.  2.  Baby aspirin 81 mg daily.  3.  Nitrostat sublingual p.r.n.  4.  Ativan 1 mg p.o. daily.  5.  Flexeril 10 mg p.r.n.  6.  Darvocet-N 100 p.r.n.  7.  Calcium.  8.  Vitamin B.  9.  Antioxidants.   PHYSICAL EXAMINATION:  GENERAL:  She was alert and oriented x3.  No acute  distress.  VITAL SIGNS:  Blood pressure was 164/96, pulse was 78, regular.  HEENT:  Conjunctiva was pink.  NECK:  Supple.  No JVD, no bruit.  LUNGS:  Clear to auscultation without rhonchi or rales.  CARDIOVASCULAR:  S1, S2 normal.  There was soft S4.  ABDOMEN:  Soft.  Bowel sounds were present.  Nontender.  EXTREMITIES:  There was no clubbing, cyanosis, or edema.   IMPRESSION:  Recurrent chest pain, mildly positive Persantine Myoview, rule  out __________ hypertension, morbid obesity, degenerative joint disease.  Discussed with the patient regarding left catheterization, its risks and  benefits, i.e., death, stroke, need for emergency  CABG, local vascular  complications, etc., and consented for the procedure.   PROCEDURE:  After obtaining the informed consent patient was brought to the  catheterization laboratory and was placed on fluoroscopy table.  Right groin  was prepped and draped in the usual fashion.  2% Xylocaine was used for  local anesthesia in the right groin.  With help of thin-wall needle 4-French  arterial sheath was placed.  Sheath was aspirated and flushed.  Next, 4-  French left Judkins catheter was advanced over the wire under fluoroscopic  guidance to the ascending aorta.  Wire was pulled out.  The catheter was  aspirated and connected to the manifold.  Catheter was further advanced and  engaged into left coronary ostium.  Multiple views of the left system were  taken.  Next, the catheter was disengaged and was pulled out over the wire  and was replaced with 4-French right Judkins catheter which was advanced  over the wire under fluoroscopic guidance up to the  ascending aorta.  Wire  was pulled out.  The catheter was aspirated and connected to the manifold.  Catheter was further advanced and engaged into right coronary ostium.  Multiple views of the right system were taken.  Next, the catheter was  disengaged and was pulled out over the wire and was replaced with 4-French  pigtail catheter which was advanced over the wire under fluoroscopic  guidance up to the ascending aorta.  Wire was pulled out.  The catheter was  aspirated and connected to the manifold.  Catheter was further advanced  across the aortic valve into the LV.  LV pressures were recorded.  Next, LV  graphy was done in 30 degree RAO position.  Post angiographic pressures were  recorded from LV and then pullback pressures were recorded from the aorta.  There was no gradient across the aortic valve.  Next, the pigtail catheter  was pulled out over the wire.  Sheaths were aspirated and flushed.   FINDINGS:  LV showed good LV systolic function, EF of 50-55%.  Left main was  short which was patent.  LAD has 15-20% mid __________ stenosis.  Diagonal 1  was small which was patent.  Diagonal 2 was very small which has 10-20%  ostial stenosis.  Ramus was very small which was patent.  Left circumflex  has 20-25% mid stenosis.  Obtuse marginal one was moderate size which was  patent.  Obtuse marginal 2 and 3 were small which were patent.  RCA was  patent.  Patient tolerated procedure well.  There are no complications.  Patient was transferred to recovery room in stable condition.           ______________________________  Eduardo Osier Sharyn Lull, M.D.     M[facility]/MEDQ  D:  06/07/2005  T:  06/07/2005  Job:  [id number]   cc:   Osvaldo Shipper. Spruill, M.D.  Fax: [phone]   Cath Lab

## 2010-09-29 NOTE — H&P (Signed)
. [facility]  Patient:    Michelle Villarreal, Michelle Villarreal Visit Number: [id number] MRN: [id number]          Service Type: Attending:  Carlisle Beers. Dorothyann Gibbs, M.D. Dictated by:   Jamelle Rushing, P.A. Adm. Date:  12/01/01                           History and Physical  DATE OF BIRTH:  23-Mar-1946  CHIEF COMPLAINT:  Bilateral knee pain.  HISTORY OF PRESENT ILLNESS:  The patient is a 65 year old black female with a history of bilateral knee chondromalacia with arthroscopic procedures done approximately 25 years ago.  The patient has had good improvement of her discomfort up until around 2001 when the patient noted some significant increased warmth in her knees while doing walking for exercise.  The patient decreased the amount of activity she performed.  Eventually, she had significant return of pain.  She also had another arthroscopic evaluation with debridement with a short-term improvement, but once again the pain returned. The patient described the pain as a deep burning sensation with a sharp shooting pain with awkward movements.  She does have occasional aching sensation down the leg to the ankle.  She dose have swelling.  She has had effusions drained several times.  She does have locking, giving out with actual falls.  She does have popping, she does have significant night pain, and she is currently using a cane to assist ambulation.  DRUG ALLERGIES: 1. ULTRAM. 2. DEMEROL.  DRUG INTOLERANCES:  CODEINE, TYLOX, PERCOCET, VICODIN, DILAUDID, and TORADOL, and TALWIN causing significant itching.  CURRENT MEDICATIONS: 1. Neurontin 300 mg p.o. b.i.d. 2. Vioxx 25 mg p.o. q.d. 3. Flexeril 10 mg p.o. q.h.s. p.r.n. 4. Darvocet-N 100 p.r.n. 5. Vitamin E with B6 and B12 1200 IU q.d. 6. Vitamin C 500 mg p.o. q.d. 7. Glucosamine 1000 mg p.o. q.d. 8. Calcium with vitamin D and magnesium 600 mg p.o. b.i.d.  PRESENT MEDICAL HISTORY: 1. The patient does have a  history of frequent bronchitis. 2. 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 3. History of blood clot in right lower extremity. 4. Obesity. 5. History of hiatal hernia.  PAST SURGICAL HISTORY: 1. Left lower extremity vein ligation. 2. Bilateral knee arthroscopies. 3. Hysterectomy. 4. Breast reduction. 5. Exploratory laparotomy with ovarian removal on the right. 6. Repeat arthroscopy bilateral knees x2.  The patient denies any complications with any of the above-mentioned surgical procedures.  SOCIAL HISTORY:  The patient is a healthy-appearing 65 year old black female. Denies any history of smoking.  She does have occasional alcoholic beverage. She is single.  She has one grown child.  She does work currently as an Astronomer. She lives in a [place] house.  FAMILY PHYSICIAN:  Dr. Donia Guiles, [phone].  FAMILY MEDICAL HISTORY:  Mother is deceased from ovarian cancer.  Father is deceased from peritonitis due to a ruptured appendix.  The patient has one brother with a history of diabetes.  One sister alive with obesity, hypertension, and seizures.  REVIEW OF SYSTEMS:  Positive for neck pain with swollen lymph nodes on the right and left frequently over the last one year.  She does have lower partial dentures.  She does wears glasses.  She has no history of seizures.  She does have a history of lower extremity glove-pattern type neuropathy with burning. She does have occasional problem with reflux  and she does have occasional increased night urinary frequency.  PHYSICAL EXAMINATION:  VITAL SIGNS:  Height 5 feet 11 inches, weight 230 pounds, pulse 76 and regular, respirations 12, temperature 98.4, blood pressure 138/90.  GENERAL:  This is a healthy-appearing, well-developed, large black female. She ambulates with significant awkward gait due to discomfort in her knee. She does use a cane in her right hand and she  is able to get on and off the exam table without difficulty.  HEENT:  Head is normocephalic, atraumatic, nontender over maxillary or frontal sinuses.  Pupils equal, round, and reactive, accommodating to light. Extraocular movements intact.  Sclerae are not icteric, conjunctivae pink and moist.  External ears without deformities, TMs pearly gray and intact.  Gross hearing is intact.  Nasal septum was midline.  Oral buccal mucosa was pink and moist.  Lower partial plate was in place; the rest of the dentition was in good repair.  Uvula was midline.  The patient was able to swallow without any difficulty.  NECK:  Supple.  No palpable lymphadenopathy.  Thyroid gland was nontender. She had good range of motion of her cervical spine without any difficulty or tenderness.  She had no tenderness to percussion along the entire spinal column.  CHEST:  Lung sounds were clear and equal bilaterally.  No wheezes, rales, rhonchi, or rubs noted.  HEART:  Regular rate and rhythm, S1 and S2 was auscultated.  No murmurs, rubs, or gallops notes.  ABDOMEN:  Round, soft, nontender.  She had no palpable hepatosplenomegaly. CVA was nontender to percussion.  EXTREMITIES:  Upper extremities were symmetrically sized and shaped and she had a full range of motion of her shoulders, elbows, and wrists without any difficulty.  Lower extremities:  The patient had excellent range of motion of her bilateral hips without any difficulty or loss of range of motion or discomfort.  Right knee was without any signs of erythema or ecchymosis, no palpable effusion.  She had full extension, flexion back to 110.  She had a 17 degree valgus deformity with about 5 degrees of laxity with valgus-varus stressing. She was tender along the medial and lateral joint line, more laterally.  She had no anterior-posterior drawer.  She had a fair amount of crepitus on the patella with range of motion and she was nontender in the calf.   Left knee was  without any signs or erythema or ecchymosis, no palpable effusion.  She was slightly tender along the medial and lateral joint line.  She had a 12 degree valgus deformity.  She had slight crepitus under the patella with range of motion which was 0 to 100 degrees.  She had no significant valgus-varus laxity, no anterior-posterior drawer.  The calf was nontender.  Bilateral ankles were symmetrical with good dorsi-plantar flexion.  PERIPHERAL VASCULAR:  Carotid pulses were 2+, no bruits; radial pulses 2+; dorsalis pedis and posterior tibial pulses were 2+.  She had no lower extremity edema or venous stasis changes with just a few scattered varicosities.  NEUROLOGIC:  The patient was conscious, alert, and appropriate, held an easy conversation with the examiner.  Cranial nerves II-XII were grossly intact. Deep tendon reflexes of the upper and lower extremities were symmetrical right to left.  The patient was grossly intact to light touch sensation from head to toe and she had no lower extremity burning sensation at the present time.  BREAST, RECTAL, AND GENITOURINARY:  Deferred at this time.  IMPRESSION: 1. Severe bilateral knee osteoarthritis, right more  symptomatic than the left. 2. History of frequent bronchitis. 3. Hiatal hernia. 4. Lower extremity neuropathy. 5. History of lower extremity blood clot. 6. Obesity.  PLAN:  The patient will be admitted to [facility] on December 01, 2001 under the care of Dr. Jonny Ruiz L. Rendall.  The patient will undergo all routine labs and tests prior to having a right total knee arthroplasty. Dictated by:   Jamelle Rushing, P.A. Attending:  Carlisle Beers. Dorothyann Gibbs, M.D. DD:  11/25/01 TD:  11/27/01 Job: [id number]

## 2010-10-25 ENCOUNTER — Other Ambulatory Visit: Payer: Self-pay | Admitting: Cardiology

## 2010-10-25 DIAGNOSIS — Z1231 Encounter for screening mammogram for malignant neoplasm of breast: Secondary | ICD-10-CM

## 2010-10-26 ENCOUNTER — Ambulatory Visit
Admission: RE | Admit: 2010-10-26 | Discharge: 2010-10-26 | Disposition: A | Discharge status: Home / Self Care | Payer: Medicare Other | Source: Ambulatory Visit | Attending: Cardiology | Admitting: Cardiology

## 2010-10-26 DIAGNOSIS — Z1231 Encounter for screening mammogram for malignant neoplasm of breast: Secondary | ICD-10-CM

## 2010-11-08 ENCOUNTER — Other Ambulatory Visit (HOSPITAL_COMMUNITY): Payer: Self-pay | Admitting: Cardiology

## 2010-11-24 ENCOUNTER — Other Ambulatory Visit (HOSPITAL_COMMUNITY): Payer: Medicare Other

## 2011-06-20 ENCOUNTER — Other Ambulatory Visit: Payer: Self-pay | Admitting: Cardiology

## 2011-06-20 DIAGNOSIS — D3500 Benign neoplasm of unspecified adrenal gland: Secondary | ICD-10-CM

## 2011-07-17 ENCOUNTER — Other Ambulatory Visit: Payer: Medicare Other

## 2011-07-18 ENCOUNTER — Ambulatory Visit
Admission: RE | Admit: 2011-07-18 | Discharge: 2011-07-18 | Disposition: A | Discharge status: Home / Self Care | Payer: Medicare Other | Source: Ambulatory Visit | Attending: Cardiology | Admitting: Cardiology

## 2011-07-18 DIAGNOSIS — D3500 Benign neoplasm of unspecified adrenal gland: Secondary | ICD-10-CM

## 2011-09-13 ENCOUNTER — Ambulatory Visit
Admission: RE | Admit: 2011-09-13 | Discharge: 2011-09-13 | Disposition: A | Discharge status: Home / Self Care | Payer: Medicare Other | Source: Ambulatory Visit | Attending: Cardiology | Admitting: Cardiology

## 2011-09-13 ENCOUNTER — Other Ambulatory Visit: Payer: Self-pay | Admitting: Cardiology

## 2011-09-13 DIAGNOSIS — N289 Disorder of kidney and ureter, unspecified: Secondary | ICD-10-CM

## 2012-06-03 ENCOUNTER — Ambulatory Visit
Admission: RE | Admit: 2012-06-03 | Discharge: 2012-06-03 | Disposition: A | Discharge status: Home / Self Care | Payer: Medicare Other | Source: Ambulatory Visit | Attending: Cardiology | Admitting: Cardiology

## 2012-06-03 ENCOUNTER — Other Ambulatory Visit: Payer: Self-pay | Admitting: Cardiology

## 2012-06-03 DIAGNOSIS — M79609 Pain in unspecified limb: Secondary | ICD-10-CM

## 2012-11-03 ENCOUNTER — Other Ambulatory Visit: Payer: Self-pay

## 2012-11-03 DIAGNOSIS — Z1231 Encounter for screening mammogram for malignant neoplasm of breast: Secondary | ICD-10-CM

## 2012-11-05 ENCOUNTER — Other Ambulatory Visit: Payer: Self-pay | Admitting: Cardiology

## 2012-11-05 DIAGNOSIS — M542 Cervicalgia: Secondary | ICD-10-CM

## 2012-11-07 ENCOUNTER — Ambulatory Visit
Admission: RE | Admit: 2012-11-07 | Discharge: 2012-11-07 | Disposition: A | Discharge status: Home / Self Care | Payer: Medicare Other | Source: Ambulatory Visit

## 2012-11-07 DIAGNOSIS — Z1231 Encounter for screening mammogram for malignant neoplasm of breast: Secondary | ICD-10-CM

## 2012-11-12 ENCOUNTER — Ambulatory Visit
Admission: RE | Admit: 2012-11-12 | Discharge: 2012-11-12 | Disposition: A | Discharge status: Home / Self Care | Payer: Medicare Other | Source: Ambulatory Visit | Attending: Cardiology | Admitting: Cardiology

## 2012-11-12 DIAGNOSIS — M542 Cervicalgia: Secondary | ICD-10-CM

## 2013-01-25 ENCOUNTER — Encounter (HOSPITAL_COMMUNITY): Payer: Self-pay | Admitting: Emergency Medicine

## 2013-01-25 ENCOUNTER — Emergency Department (HOSPITAL_COMMUNITY): Payer: Medicare Other

## 2013-01-25 ENCOUNTER — Emergency Department (HOSPITAL_COMMUNITY)
Admission: EM | Admit: 2013-01-25 | Discharge: 2013-01-25 | Disposition: A | Discharge status: Home / Self Care | Payer: Medicare Other | Attending: Emergency Medicine | Admitting: Emergency Medicine

## 2013-01-25 DIAGNOSIS — IMO0002 Reserved for concepts with insufficient information to code with codable children: Secondary | ICD-10-CM | POA: Insufficient documentation

## 2013-01-25 DIAGNOSIS — M25519 Pain in unspecified shoulder: Secondary | ICD-10-CM | POA: Insufficient documentation

## 2013-01-25 DIAGNOSIS — R200 Anesthesia of skin: Secondary | ICD-10-CM

## 2013-01-25 DIAGNOSIS — Z79899 Other long term (current) drug therapy: Secondary | ICD-10-CM | POA: Insufficient documentation

## 2013-01-25 DIAGNOSIS — M5410 Radiculopathy, site unspecified: Secondary | ICD-10-CM

## 2013-01-25 DIAGNOSIS — R209 Unspecified disturbances of skin sensation: Secondary | ICD-10-CM | POA: Insufficient documentation

## 2013-01-25 DIAGNOSIS — Z87891 Personal history of nicotine dependence: Secondary | ICD-10-CM | POA: Insufficient documentation

## 2013-01-25 DIAGNOSIS — Z791 Long term (current) use of non-steroidal anti-inflammatories (NSAID): Secondary | ICD-10-CM | POA: Insufficient documentation

## 2013-01-25 LAB — BASIC METABOLIC PANEL
CO2: 27 mEq/L (ref 19–32)
Calcium: 9.2 mg/dL (ref 8.4–10.5)
Chloride: 105 mEq/L (ref 96–112)
Potassium: 4.3 mEq/L (ref 3.5–5.1)
Sodium: 140 mEq/L (ref 135–145)

## 2013-01-25 MED ORDER — KETOROLAC TROMETHAMINE 30 MG/ML IJ SOLN
30.0000 mg | Freq: Once | INTRAMUSCULAR | Status: AC
  Administered 2013-01-25: 30 mg via INTRAMUSCULAR
  Filled 2013-01-25: qty 1

## 2013-01-25 MED ORDER — DIAZEPAM 5 MG PO TABS: 5.0000 mg | ORAL_TABLET | Freq: Two times a day (BID) | ORAL | Status: DC | PRN

## 2013-01-25 NOTE — ED Provider Notes (Signed)
CSN: [id number]     Arrival date & time 01/25/13  7829 History   First MD Initiated Contact with Patient 01/25/13 [phone]     Chief Complaint  Patient presents with  . Numbness  . Back Pain   (Consider location/radiation/quality/duration/timing/severity/associated sxs/prior Treatment) HPI This is a 67 year old female who presents with shoulder pain and left arm numbness. The patient states that she had similar symptoms back in noon. At that time she was having left breast, neck, and jaw pain. Her primary care physician refer her for an MRI of the cervical spine. This was obtained on July 2. At that time images show degenerative changes of the entire cervical spine without impingement of the cord. Patient states that approximately 10 days ago she started having pain in her left arm and shoulder. 2 days ago she noted numbness over the left third through fifth digits on the palmar aspect of her hand. Patient has tried Tylenol, ibuprofen, heating pad with very little improvement of her pain. Patient denies any weakness. She denies any numbness or tingling of the bilateral lower extremity or difficulty urinating. Patient denies any neck pain. The back pain that she described to the nurse in triage is more scapular pain over her left shoulder. History reviewed. No pertinent past medical history. History reviewed. No pertinent past surgical history. History reviewed. No pertinent family history. History  Substance Use Topics  . Smoking status: Former Games developer  . Smokeless tobacco: Not on file  . Alcohol Use: Yes   OB History   Grav Para Term Preterm Abortions TAB SAB Ect Mult Living                 Review of Systems  Constitutional: Negative for fever.  HENT: Negative for neck pain.   Respiratory: Negative for cough, chest tightness and shortness of breath.   Cardiovascular: Negative for chest pain.  Gastrointestinal: Negative for nausea, vomiting and abdominal pain.  Genitourinary: Negative for  dysuria.       No retention  Musculoskeletal: Positive for back pain. Negative for gait problem.  Skin: Negative for wound.  Neurological: Negative for dizziness, syncope and headaches.  Psychiatric/Behavioral: Negative for confusion.  All other systems reviewed and are negative.    Allergies  Ultram; Arixtra; Benzodiazepines; Codeine; Demerol; Gabapentin; Lyrica; Morphine and related; and Zoloft  Home Medications   Current Outpatient Rx  Name  Route  Sig  Dispense  Refill  . carvedilol (COREG) 25 MG tablet   Oral   Take 25 mg by mouth 2 (two) times daily with a meal.         . cholecalciferol (VITAMIN D) 1000 UNITS tablet   Oral   Take 1,000 Units by mouth daily.         . magnesium oxide (MAG-OX) 400 MG tablet   Oral   Take 400 mg by mouth every morning.         . naproxen sodium (ANAPROX) 220 MG tablet   Oral   Take 220 mg by mouth 2 (two) times daily with a meal.         . vitamin E 400 UNIT capsule   Oral   Take 400-800 Units by mouth every morning.         . diazepam (VALIUM) 5 MG tablet   Oral   Take 1 tablet (5 mg total) by mouth every 12 (twelve) hours as needed for anxiety.   5 tablet   0    BP 140/78  Pulse  78  Temp(Src) 98.2 F (36.8 C) (Oral)  Resp 20  SpO2 97% Physical Exam  Nursing note and vitals reviewed. Constitutional: She is oriented to person, place, and time. She appears well-developed and well-nourished.  HENT:  Head: Normocephalic and atraumatic.  Eyes: Pupils are equal, round, and reactive to light.  Neck: Normal range of motion. Neck supple.  No midline cervical tenderness  Cardiovascular: Normal rate, regular rhythm and normal heart sounds.   Pulmonary/Chest: Effort normal. No respiratory distress. She has no wheezes.  Abdominal: Soft. Bowel sounds are normal.  Musculoskeletal:  Tenderness to palpation over the left scapula and shoulder, for range of motion, no tenderness or deformity at the elbow  Neurological: She  is alert and oriented to person, place, and time. She displays normal reflexes. She exhibits normal muscle tone. Coordination normal.  55 strength in all 4 extremities including grip strength bilaterally.  Normal sensation and proprioception of the left hand and fingers  Skin: Skin is warm and dry.  Psychiatric: She has a normal mood and affect.    ED Course  Procedures (including critical care time) Labs Review Labs Reviewed  BASIC METABOLIC PANEL - Abnormal; Notable for the following:    Glucose, Bld 109 (*)    All other components within normal limits   Imaging Review Dg Chest 2 View  01/25/2013   *RADIOLOGY REPORT*  Clinical Data: Numbness and back pain, left arm numbness  CHEST - 2 VIEW  Comparison: 07/29/2009  Findings: The heart size and vascular pattern are normal.  The lungs are clear.  There are no pleural effusions.  IMPRESSION: Negative   Original Report Authenticated By: Esperanza Heir, M.D.    MDM   1. Radiculopathy   2. Numbness     This is a 67 year old female who presents with left upper extremity numbness and scapular pain. She is nontoxic-appearing on exam and her vital signs are within normal limits.  Neurologic exam is completely normal. Patient has intact sensation and proprioception.  My suspicion is that patient is having radicular pain secondary to nerve impingement. I reviewed her MRI which showed degenerative changes at C8, T1.  Patient can also be entrapped distally but has no evidence of elbow pain or deformity. I offered repeat MRI to evaluate for nerve impingement the patient is concerned that her insurance will not reimburse her. Patient was given IM Toradol after review of her creatinine was within normal limits. Patient has multiple allergies and it does not tolerate pain medications in general. Given her age, I am reluctant to give her toward all by mouth at home. She was encouraged continued anti-inflammatories for the next several days. Patient will be  given Valium 5 mg by mouth to see if that helps her and she will followup with her primary care physician. She was instructed to return if she has any worsening numbness, weakness, difficulty speaking or any other concerning symptoms  After history, exam, and medical workup I feel the patient has been appropriately medically screened and is safe for discharge home. Pertinent diagnoses were discussed with the patient. Patient was given return precautions.   Shon Baton, MD 01/25/13 1128

## 2013-01-25 NOTE — ED Notes (Signed)
Pt arrived to the ED with a complaint of arm numbness.  Pt is also complaining of back pain.  The numbness and tingling in the pt arm has been going on for the last two days.  Pt has had the back pain since June of this year.  Pt has taken NSAID without relief

## 2013-01-27 ENCOUNTER — Other Ambulatory Visit: Payer: Self-pay | Admitting: Internal Medicine

## 2013-01-27 DIAGNOSIS — E278 Other specified disorders of adrenal gland: Secondary | ICD-10-CM

## 2013-01-29 ENCOUNTER — Other Ambulatory Visit (HOSPITAL_COMMUNITY): Payer: Medicare Other

## 2013-12-29 ENCOUNTER — Other Ambulatory Visit: Payer: Self-pay

## 2013-12-29 DIAGNOSIS — Z1231 Encounter for screening mammogram for malignant neoplasm of breast: Secondary | ICD-10-CM

## 2014-01-12 ENCOUNTER — Ambulatory Visit
Admission: RE | Admit: 2014-01-12 | Discharge: 2014-01-12 | Disposition: A | Discharge status: Home / Self Care | Payer: Medicare Other | Source: Ambulatory Visit

## 2014-01-12 ENCOUNTER — Encounter (INDEPENDENT_AMBULATORY_CARE_PROVIDER_SITE_OTHER): Payer: Self-pay

## 2014-01-12 DIAGNOSIS — Z1231 Encounter for screening mammogram for malignant neoplasm of breast: Secondary | ICD-10-CM

## 2014-01-20 IMAGING — CR DG CHEST 2V
2 series · 2 of 2 positions shown · non-contrast
Comparison: 07/29/2009

CLINICAL DATA: Numbness and back pain, left arm numbness

CHEST - 2 VIEW

[w chest pa]
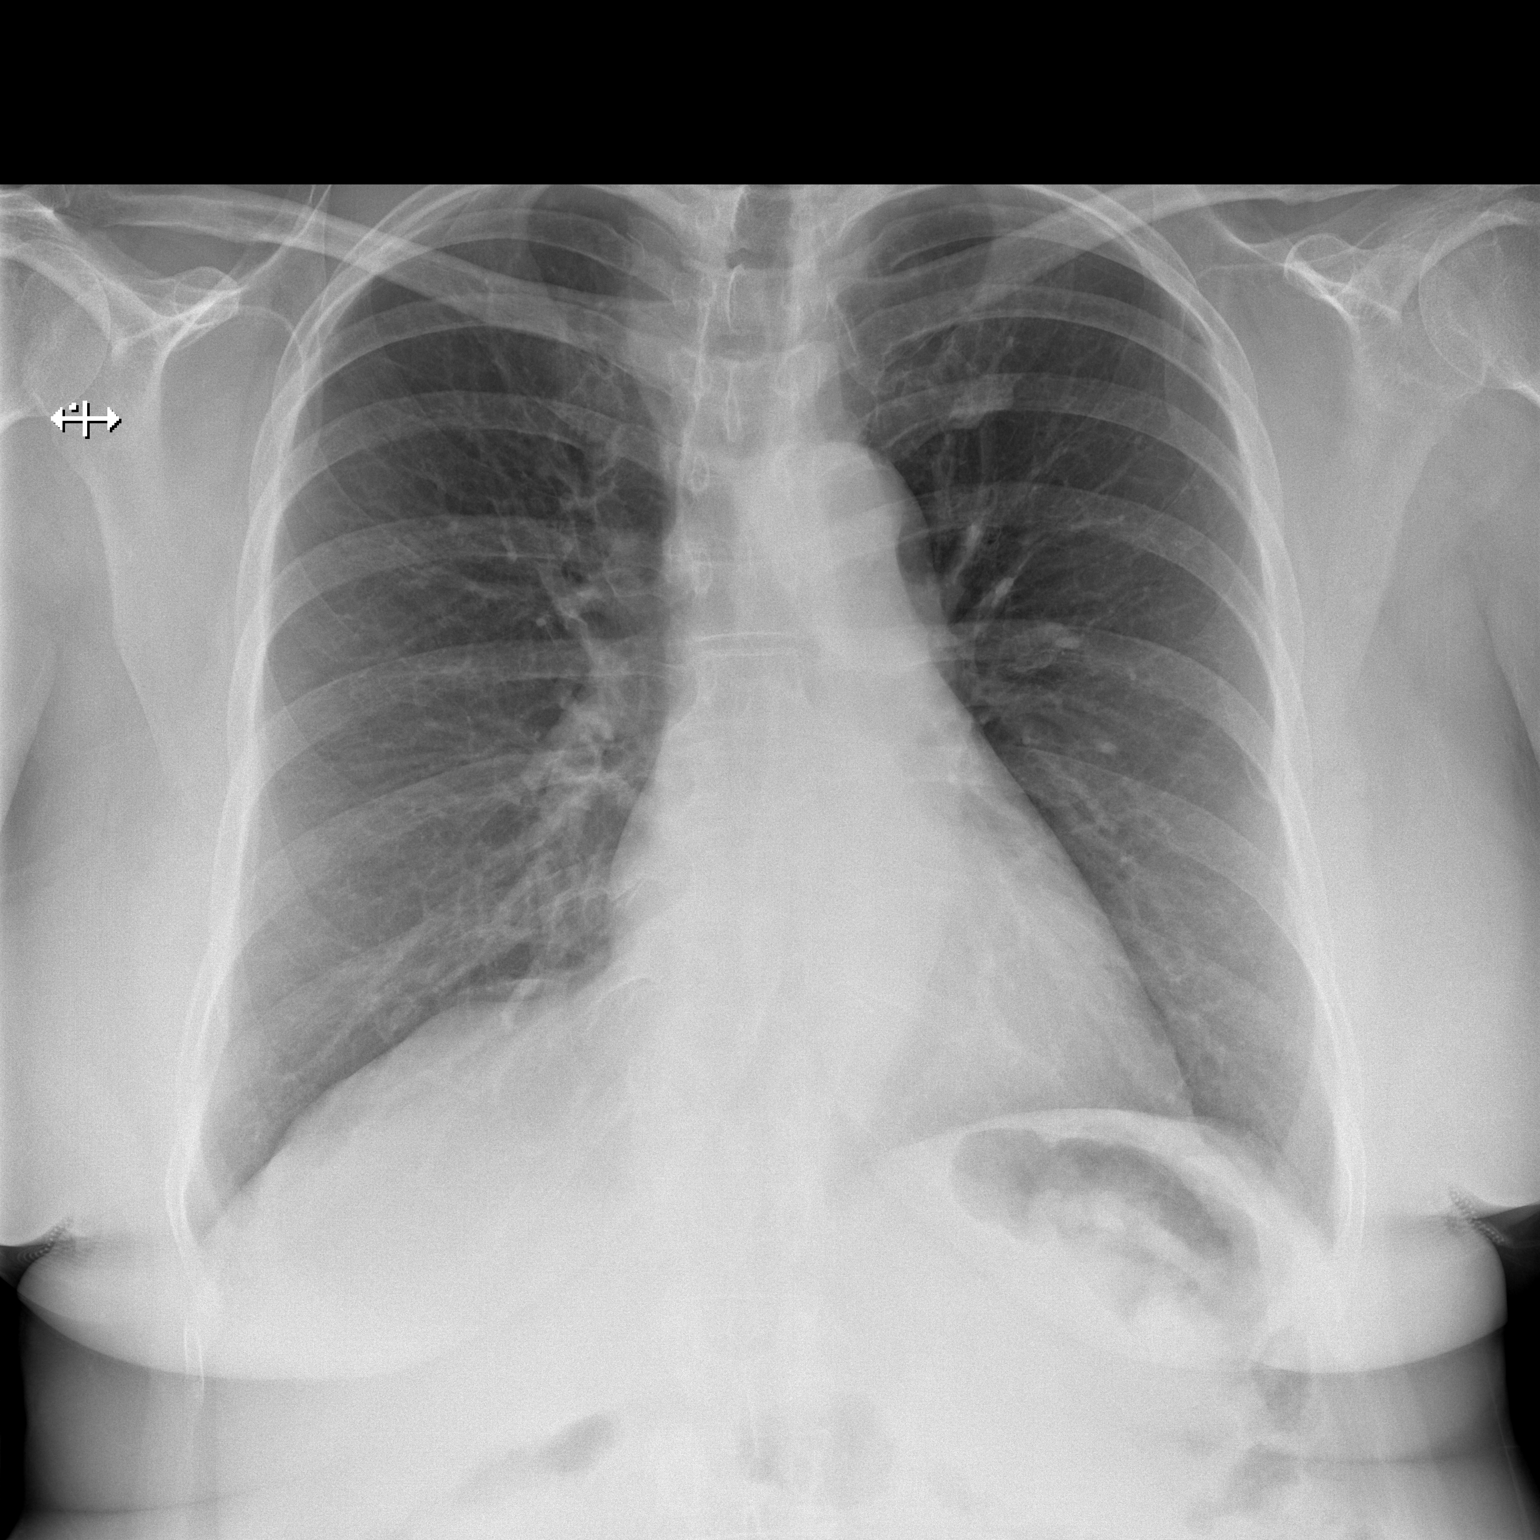

[w chest lat]
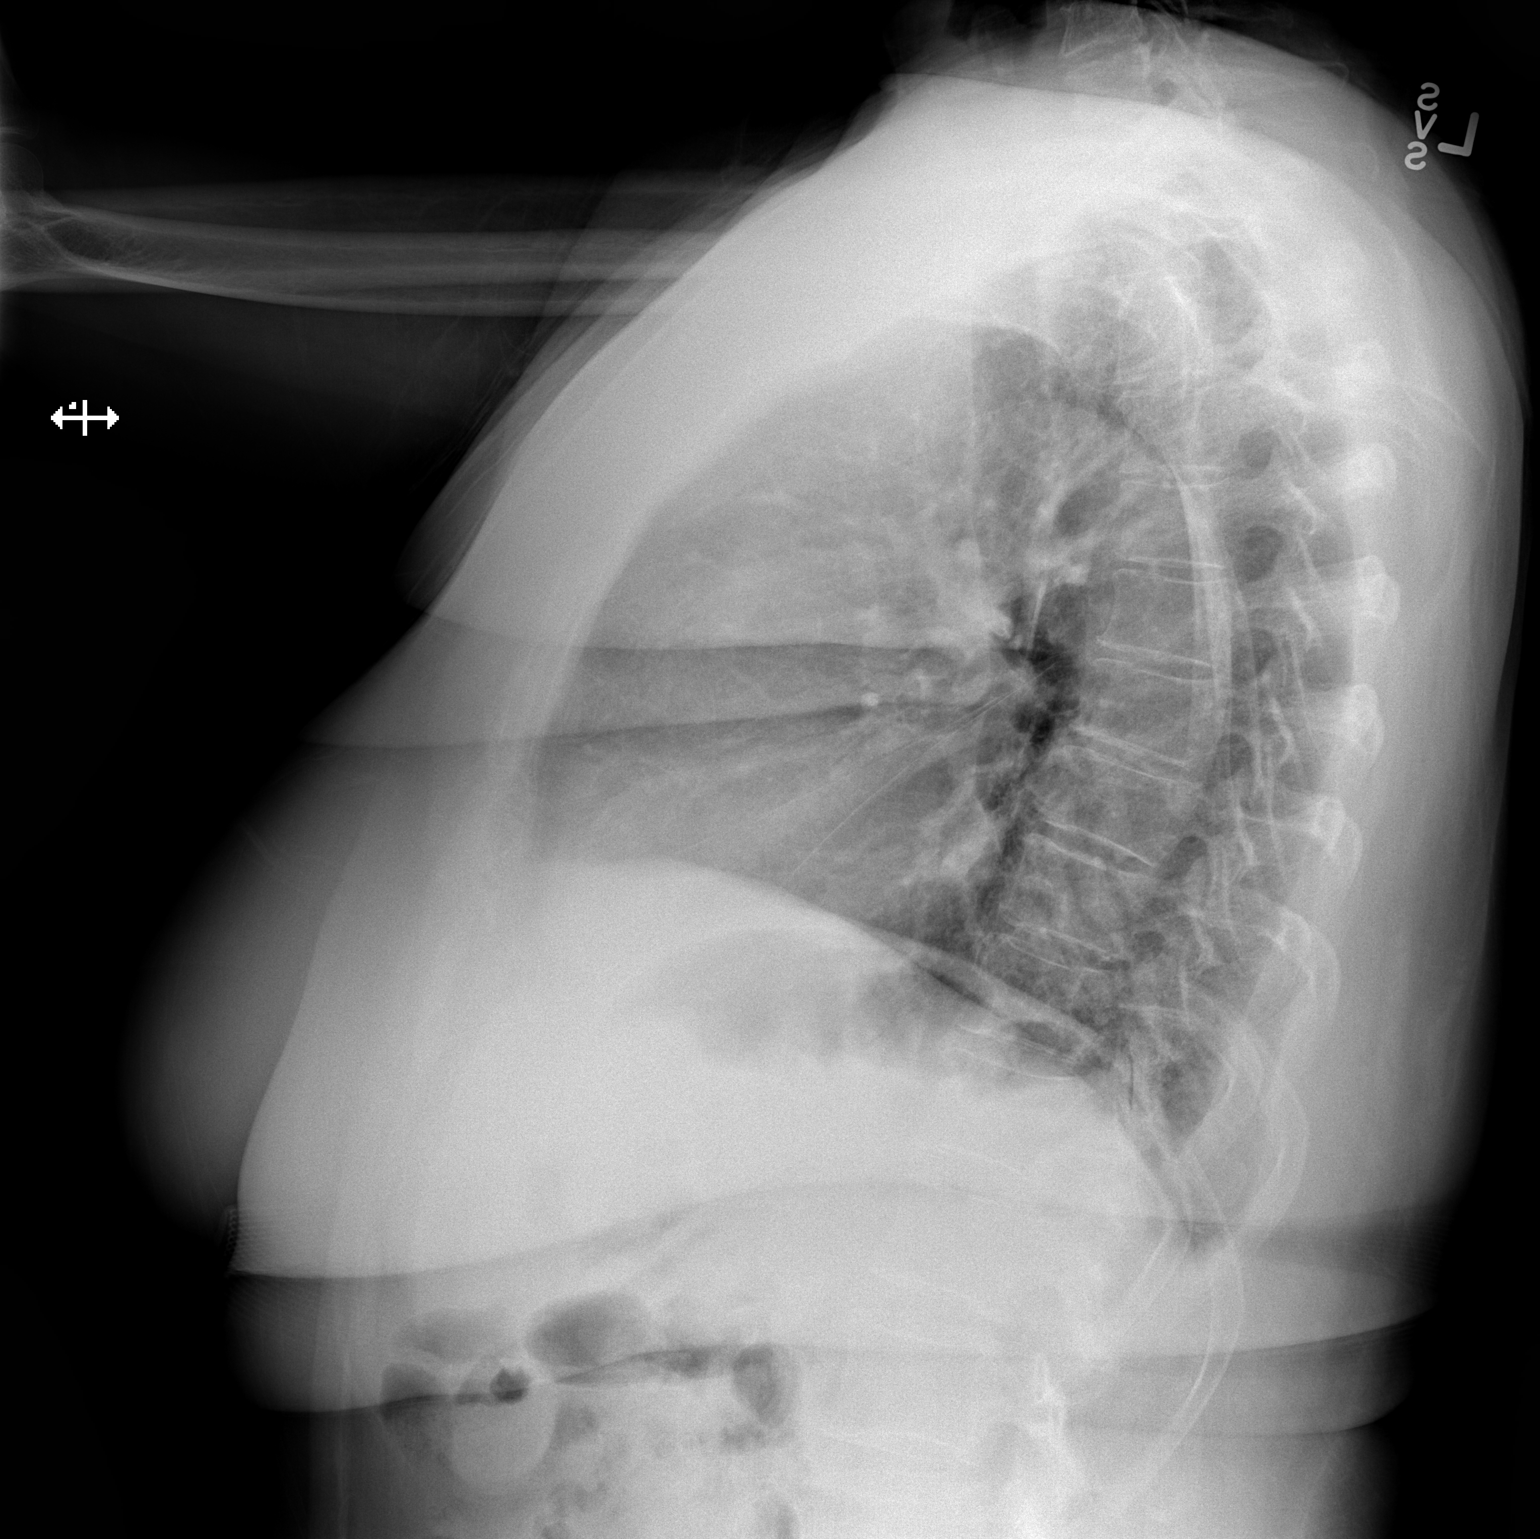

[2 of 2 positions shown; findings below may reference images not displayed]

FINDINGS: The heart size and vascular pattern are normal.  The
lungs are clear.  There are no pleural effusions.
IMPRESSION: Negative

## 2015-06-06 ENCOUNTER — Other Ambulatory Visit: Payer: Self-pay

## 2015-06-06 DIAGNOSIS — Z1231 Encounter for screening mammogram for malignant neoplasm of breast: Secondary | ICD-10-CM

## 2015-06-07 ENCOUNTER — Ambulatory Visit: Payer: Self-pay

## 2015-06-07 ENCOUNTER — Ambulatory Visit
Admission: RE | Admit: 2015-06-07 | Discharge: 2015-06-07 | Disposition: A | Discharge status: Home / Self Care | Payer: Medicare Other | Source: Ambulatory Visit

## 2015-06-07 DIAGNOSIS — Z1231 Encounter for screening mammogram for malignant neoplasm of breast: Secondary | ICD-10-CM

## 2015-06-08 ENCOUNTER — Ambulatory Visit: Payer: Self-pay

## 2015-07-07 ENCOUNTER — Other Ambulatory Visit (HOSPITAL_COMMUNITY): Payer: Self-pay | Admitting: Otolaryngology

## 2015-07-07 DIAGNOSIS — H9311 Tinnitus, right ear: Secondary | ICD-10-CM

## 2015-07-14 ENCOUNTER — Other Ambulatory Visit (HOSPITAL_COMMUNITY): Payer: Self-pay | Admitting: Otolaryngology

## 2015-07-14 ENCOUNTER — Ambulatory Visit (HOSPITAL_COMMUNITY)
Admission: RE | Admit: 2015-07-14 | Discharge: 2015-07-14 | Disposition: A | Discharge status: Home / Self Care | Payer: Medicare Other | Source: Ambulatory Visit | Attending: Otolaryngology | Admitting: Otolaryngology

## 2015-07-14 DIAGNOSIS — H9311 Tinnitus, right ear: Secondary | ICD-10-CM

## 2015-07-14 LAB — POCT I-STAT CREATININE: Creatinine, Ser: 0.6 mg/dL (ref 0.44–1.00)

## 2015-07-20 ENCOUNTER — Encounter (HOSPITAL_COMMUNITY): Payer: Self-pay | Admitting: Emergency Medicine

## 2015-07-20 ENCOUNTER — Emergency Department (HOSPITAL_COMMUNITY)
Admission: EM | Admit: 2015-07-20 | Discharge: 2015-07-20 | Disposition: A | Discharge status: Home / Self Care | Payer: Medicare Other | Attending: Emergency Medicine | Admitting: Emergency Medicine

## 2015-07-20 DIAGNOSIS — R42 Dizziness and giddiness: Secondary | ICD-10-CM | POA: Diagnosis not present

## 2015-07-20 DIAGNOSIS — Z87891 Personal history of nicotine dependence: Secondary | ICD-10-CM | POA: Diagnosis not present

## 2015-07-20 DIAGNOSIS — H9311 Tinnitus, right ear: Secondary | ICD-10-CM | POA: Diagnosis present

## 2015-07-20 DIAGNOSIS — Z791 Long term (current) use of non-steroidal anti-inflammatories (NSAID): Secondary | ICD-10-CM | POA: Diagnosis not present

## 2015-07-20 DIAGNOSIS — Z79899 Other long term (current) drug therapy: Secondary | ICD-10-CM | POA: Diagnosis not present

## 2015-07-20 MED ORDER — METHYLPREDNISOLONE 4 MG PO TBPK: ORAL_TABLET | ORAL | Status: DC

## 2015-07-20 MED ORDER — DEXAMETHASONE SODIUM PHOSPHATE 10 MG/ML IJ SOLN
6.0000 mg | Freq: Once | INTRAMUSCULAR | Status: AC
  Administered 2015-07-20: 6 mg via INTRAMUSCULAR
  Filled 2015-07-20: qty 1

## 2015-07-20 NOTE — ED Provider Notes (Signed)
CSN: [id number]     Arrival date & time 07/20/15  2111 History   First MD Initiated Contact with Patient 07/20/15 2227     Chief Complaint  Patient presents with  . Tinnitus     (Consider location/radiation/quality/duration/timing/severity/associated sxs/prior Treatment) HPI Comments: The pt is a 70 y/o female - she has a history of tinnitus for several months after having a head injury.  She's been seen by her family doctor as well as ENT, she had an MRI ordered but was unable to tolerate the procedure. She states that this is a constant, pulsating, swishing, loud sound. She is having difficulty sleeping because of the sound. She has a little bit of lightheadedness, she has not had any visual changes numbness or weakness.  The history is provided by the patient.    History reviewed. No pertinent past medical history. History reviewed. No pertinent past surgical history. No family history on file. Social History  Substance Use Topics  . Smoking status: Former Research scientist (life sciences)  . Smokeless tobacco: None  . Alcohol Use: Yes   OB History    No data available     Review of Systems  All other systems reviewed and are negative.     Allergies  Ultram; Arixtra; Benzodiazepines; Codeine; Demerol; Gabapentin; Lyrica; Morphine and related; and Zoloft  Home Medications   Prior to Admission medications   Medication Sig Start Date End Date Taking? Authorizing Provider  carvedilol (COREG) 25 MG tablet Take 25 mg by mouth 2 (two) times daily with a meal.    Historical Provider, MD  cholecalciferol (VITAMIN D) 1000 UNITS tablet Take 1,000 Units by mouth daily.    Historical Provider, MD  diazepam (VALIUM) 5 MG tablet Take 1 tablet (5 mg total) by mouth every 12 (twelve) hours as needed for anxiety. 01/25/13   Merryl Hacker, MD  magnesium oxide (MAG-OX) 400 MG tablet Take 400 mg by mouth every morning.    Historical Provider, MD  methylPREDNISolone (MEDROL DOSEPAK) 4 MG TBPK tablet Taper over 6  days please 07/20/15   Noemi Chapel, MD  naproxen sodium (ANAPROX) 220 MG tablet Take 220 mg by mouth 2 (two) times daily with a meal.    Historical Provider, MD  vitamin E 400 UNIT capsule Take 400-800 Units by mouth every morning.    Historical Provider, MD   BP 160/97 mmHg  Pulse 85  Temp(Src) 97.8 F (36.6 C) (Oral)  Resp 18  SpO2 98% Physical Exam  Constitutional: She appears well-developed and well-nourished. No distress.  HENT:  Head: Normocephalic and atraumatic.  Mouth/Throat: Oropharynx is clear and moist. No oropharyngeal exudate.  Tympanic membranes clear bilaterally  Eyes: Conjunctivae and EOM are normal. Pupils are equal, round, and reactive to light. Right eye exhibits no discharge. Left eye exhibits no discharge. No scleral icterus.  Neck: Normal range of motion. Neck supple. No JVD present. No thyromegaly present.  Cardiovascular: Normal rate, regular rhythm, normal heart sounds and intact distal pulses.  Exam reveals no gallop and no friction rub.   No murmur heard. Pulmonary/Chest: Effort normal.  Lymphadenopathy:    She has no cervical adenopathy.  Neurological: She is alert. Coordination normal.  The patient has normal speech and coordination, her gait is slightly off balance but she does walk with a cane at baseline  Skin: Skin is warm and dry. No rash noted. No erythema.  Psychiatric: She has a normal mood and affect. Her behavior is normal.  Nursing note and vitals reviewed.  ED Course  Procedures (including critical care time) Labs Review Labs Reviewed - No data to display  Imaging Review No results found. I have personally reviewed and evaluated these images and lab results as part of my medical decision-making.    MDM   Final diagnoses:  Tinnitus, right    The patient has tinnitus, this appears to be something she has dealt with for chronic several month fashion, vital signs with mild hypertension, she has not received any medications for this,  we'll try a Medrol Dosepak, the patient is in agreement, she will follow up with ENT to pursue the MRI that she needs  Meds given in ED:  Medications  dexamethasone (DECADRON) injection 6 mg (not administered)    New Prescriptions   METHYLPREDNISOLONE (MEDROL DOSEPAK) 4 MG TBPK TABLET    Taper over 6 days please      Noemi Chapel, MD 07/20/15 2309

## 2015-07-20 NOTE — ED Notes (Signed)
The pt has had a trhobbing npise in her rt ear  For 2 months.  She was seen jan 24th and a mri was ordered by dr crossly.  She was unable to get the mri done.  It is worse  Since in the past few weeks.  She has pain in her rt ear for the  First timemonday. She stumbled this evening.  She fell in nov 2016 and since tghen she has had problems

## 2015-07-20 NOTE — Discharge Instructions (Signed)

## 2015-07-20 NOTE — ED Notes (Signed)
Pt. reports worsening tinnitus at right ear for 2 months described as " pulsating / swishing sound" ,insomnia , with dizziness and lightheadedness this week . Seen by her ENT MD scheduled an MRI but can not tolerate procedure .

## 2015-07-25 ENCOUNTER — Telehealth: Payer: Self-pay | Admitting: *Deleted

## 2015-07-25 NOTE — Telephone Encounter (Signed)
Called pt and made NP appt on 3/15 at 230pm, check in 200pm. Per dr Jaynee Eagles request. Dr Ernesto Rutherford requested this. Pt could not complete MRI d/t arthritis. She is on day 5 of medrol dose pak. She went to ER on 3/8 and saw Noemi Chapel. Advised to bring meds and updated insurance. She verbalized understanding.

## 2015-07-27 ENCOUNTER — Ambulatory Visit (INDEPENDENT_AMBULATORY_CARE_PROVIDER_SITE_OTHER): Payer: Medicare Other | Admitting: Neurology

## 2015-07-27 ENCOUNTER — Encounter: Payer: Self-pay | Admitting: Neurology

## 2015-07-27 VITALS — BP 135/91 | HR 84 | Ht 69.5 in | Wt 243.8 lb

## 2015-07-27 DIAGNOSIS — R51 Headache: Secondary | ICD-10-CM | POA: Diagnosis not present

## 2015-07-27 DIAGNOSIS — G609 Hereditary and idiopathic neuropathy, unspecified: Secondary | ICD-10-CM

## 2015-07-27 DIAGNOSIS — E119 Type 2 diabetes mellitus without complications: Secondary | ICD-10-CM | POA: Insufficient documentation

## 2015-07-27 DIAGNOSIS — I10 Essential (primary) hypertension: Secondary | ICD-10-CM | POA: Diagnosis not present

## 2015-07-27 DIAGNOSIS — H93A1 Pulsatile tinnitus, right ear: Secondary | ICD-10-CM | POA: Diagnosis not present

## 2015-07-27 DIAGNOSIS — R519 Headache, unspecified: Secondary | ICD-10-CM

## 2015-07-27 DIAGNOSIS — M797 Fibromyalgia: Secondary | ICD-10-CM

## 2015-07-27 NOTE — Progress Notes (Signed)
[facility]    Provider:  Dr Jaynee Eagles Referring Provider: Minna Merritts M.D. Primary Care Physician:  Kandice Hams, MD  CC:  Pulsating and pounding in ears  HPI:  Michelle Villarreal is a 70 y.o. female here as a referral from Dr. Delfina Redwood and Dr. Ernesto Rutherford for right ear booming tinnitus. Past medical history of hypertension, fall, chronic pain, osteoarthritis, neuropathy, diabetes, cataracts fibromyalgia. Murmur started in the right ear. It went away then started booming, pounding, Sounds like blood flowing through a vessel. She had a fall Nov 1st and symptoms started 3 weeks afterwards but she does not think this had anything to do with it, she did not hit her head. Sometimes it is soft other times is unbelieveble. Only in the right ear.  Sometimes it is roaring, sometimes like a banjo and her ear vibrates along with it and the pressure is severe. Started 2 months ago but in the last week have increased in severity. The pain behind the ear is new about a week. It is so loud it wakes her up in the middle of the night. Just in the right ear. Worse at night. Comes and goes during the day. Has tried alleve, tylenol doesn't help. Doesn't drink caffeine. She has tried lots of things, nothing helps. She is on a medrol dosepak now. She was given Decadron. No PMHx of headaches or migraines. May get a headache with blood pressure or with dehydration but rare. But until a week ago it was just noise,vibration and no pain. A week ago started feeling pressure in the ear and pain behind the ear. The pain behind the ear is tenderness, painful, not tingly, not electric. Tenderness behind the left ear and in the temples. Has also had stabbing pain through the ear then it went away. She also had a fall the last day of January at the same spot, didn't hit her head. She cannot tolerate the MRI.   Reviewed notes, labs and imaging from outside physicians, which showed:  Labs were drawn 06/08/2015: CMP normal  creatinine of 0.76. TSH 1.75.  Patient was evaluated by Dr. Ernesto Rutherford. She came with a complaint of right ear booming tinnitus related to her pulse. ENT evaluation reveals both ears to be completely clear after he remove cerumen from the right ear, tympanic membranes move well, no  evidence of fluid behind the tympanic membrane. Nose is completely clear. Larynx is clear. True cords, false cord, epiglottis, base of tongue, lateral pharyngeal walls are clear, and she cord mobility, Reflex, tongue mobility, extraocular movements, facial nerve are all symmetrical. Neck is free of thyromegaly, cervical adenopathy or masses. Trachea midline. No evidence of bruit. No cardiac arrhythmias. No history of cardiac problem. Oriented 3. Diagnosis is possible vertigo, she has a history of orthostatic hypotension. Plan is for hearing test attempted MRI of the brain patient did not tolerate. On audiogram, she has a 15 dB speech reception threshold on both sides and in 96 and 100% discrimination score.  Review of Systems: Patient complains of symptoms per HPI as well as the following symptoms: Headache, insomnia, depression, joint pain, joint swelling, incontinence. Pertinent negatives per HPI. All others negative.   Social History   Social History  . Marital Status: Single    Spouse Name: N/A  . Number of Children: N/A  . Years of Education: N/A   Occupational History  . Not on file.   Social History Main Topics  . Smoking status: Former Research scientist (life sciences)  . Smokeless tobacco: Not  on file  . Alcohol Use: Yes  . Drug Use: No  . Sexual Activity: No   Other Topics Concern  . Not on file   Social History Narrative   Lives alone   Caffeine use: minimal     Family History  Problem Relation Age of Onset  . Stroke Neg Hx   . Migraines Neg Hx   . Ataxia Neg Hx     Past Medical History  Diagnosis Date  . Hypertension   . Tinnitus   . Fall     09/30/13, 03/15/15, 06/14/15  . Chronic pain   . Osteoarthritis     . Neuropathy ([place])   . Diabetes ([place])   . Bladder incontinence   . Adenoma     adrenal gland  . Cataracts, bilateral   . Fibromyalgia     Past Surgical History  Procedure Laterality Date  . Replacement total knee Bilateral 2003/2004  . Breast reduction surgery    . Vaginal hysterectomy    . Bunionectomy    . Tubal ligation      Current Outpatient Prescriptions  Medication Sig Dispense Refill  . carvedilol (COREG) 12.5 MG tablet Take 12.5 mg by mouth 2 (two) times daily.  4  . cholecalciferol (VITAMIN D) 1000 UNITS tablet Take 1,000 Units by mouth daily.    . naproxen sodium (ANAPROX) 220 MG tablet Take 220 mg by mouth 2 (two) times daily with a meal.    . vitamin E 400 UNIT capsule Take 400-800 Units by mouth every morning.     No current facility-administered medications for this visit.    Allergies as of 07/27/2015 - Review Complete 07/20/2015  Allergen Reaction Noted  . Ultram [tramadol] Shortness Of Breath 01/25/2013  . Arixtra [fondaparinux] Other (See Comments) 01/25/2013  . Benzodiazepines Other (See Comments) 01/25/2013  . Codeine Itching 01/25/2013  . Demerol [meperidine] Nausea Only 01/25/2013  . Gabapentin Other (See Comments) 01/25/2013  . Lyrica [pregabalin] Other (See Comments) 01/25/2013  . Morphine and related Hives 01/25/2013  . Zoloft [sertraline hcl] Other (See Comments) 01/25/2013    Vitals: BP 135/91 mmHg  Pulse 84  Ht 5' 9.5" (1.765 m)  Wt 243 lb 12.8 oz (110.587 kg)  BMI 35.50 kg/m2 Last Weight:  Wt Readings from Last 1 Encounters:  07/27/15 243 lb 12.8 oz (110.587 kg)   Last Height:   Ht Readings from Last 1 Encounters:  07/27/15 5' 9.5" (1.765 m)    Physical exam: Exam: Gen: NAD, conversant, well nourised, obese, well groomed                     CV: RRR, no MRG. No Carotid Bruits. No peripheral edema, warm, nontender Eyes: Conjunctivae clear without exudates or hemorrhage  Neuro: Detailed Neurologic Exam  Speech:    Speech  is normal; fluent and spontaneous with normal comprehension.  Cognition:    The patient is oriented to person, place, and time;     recent and remote memory intact;     language fluent;     normal attention, concentration,     fund of knowledge Cranial Nerves:    The pupils are equal, round, and reactive to light. The fundi are normal and spontaneous venous pulsations are present. Visual fields are full to finger confrontation. Extraocular movements are intact. Trigeminal sensation is intact and the muscles of mastication are normal. The face is symmetric. The palate elevates in the midline. Hearing intact. Voice is normal. Shoulder shrug is normal.  The tongue has normal motion without fasciculations.   Coordination:    Normal finger to nose and heel to shin. Normal rapid alternating movements.   Gait:    Heel-toe and tandem gait are normal.   Motor Observation:    No asymmetry, no atrophy, and no involuntary movements noted. Tone:    Normal muscle tone.    Posture:    Posture is normal. normal erect    Strength:    Strength is V/V in the upper and lower limbs.      Sensation: intact to LT     Reflex Exam:  DTR's: Patellars surgical otherwise deep tendon reflexes in the upper and lower extremities are normal bilaterally.   Toes:    The toes are downgoing bilaterally.   Clonus:    Clonus is absent.       Assessment/Plan:  70 year old female here for 2 months abnormal hearing in the right ear. She describes pulsatile tinnitus, loud noises such as a murmur, booming sound like blood flowing through the vessel. Very concerning for aneurysm. We'll send her for CTA of the head. Dr. Ernesto Rutherford attempted MRI of the brain which she did not tolerate. If CTA unremarkable May consider MRi of the brain.  Sarina Ill, MD  [facility] [address] [place], [place] [address]  Phone [phone] Fax [phone]

## 2015-07-27 NOTE — Patient Instructions (Signed)
Remember to drink plenty of fluid, eat healthy meals and do not skip any meals. Try to eat protein with a every meal and eat a healthy snack such as fruit or nuts in between meals. Try to keep a regular sleep-wake schedule and try to exercise daily, particularly in the form of walking, 20-30 minutes a day, if you can.   As far as diagnostic testing: CT Angio of the head  I would like to see you back after imaging, sooner if we need to. Please call us with any interim questions, concerns, problems, updates or refill requests.   Our phone number is [phone]. We also have an after hours call service for urgent matters and there is a physician on-call for urgent questions. For any emergencies you know to call 911 or go to the nearest emergency room

## 2015-07-28 ENCOUNTER — Ambulatory Visit
Admission: RE | Admit: 2015-07-28 | Discharge: 2015-07-28 | Disposition: A | Discharge status: Home / Self Care | Payer: Medicare Other | Source: Ambulatory Visit | Attending: Neurology | Admitting: Neurology

## 2015-07-28 DIAGNOSIS — H93A1 Pulsatile tinnitus, right ear: Secondary | ICD-10-CM

## 2015-07-28 DIAGNOSIS — R51 Headache: Secondary | ICD-10-CM

## 2015-07-28 DIAGNOSIS — R519 Headache, unspecified: Secondary | ICD-10-CM

## 2015-07-28 MED ORDER — IOPAMIDOL (ISOVUE-370) INJECTION 76%
100.0000 mL | Freq: Once | INTRAVENOUS | Status: AC | PRN
  Administered 2015-07-28: 100 mL via INTRAVENOUS

## 2015-07-29 ENCOUNTER — Telehealth: Payer: Self-pay | Admitting: *Deleted

## 2015-07-29 NOTE — Telephone Encounter (Signed)
Dr Jaynee Eagles- please advise.  Called and spoke to pt about CT results per Dr Jaynee Eagles note. She verbalized understanding. Pt would like to know next steps. Advised I have to speak to Dr Jaynee Eagles. She is not in the office today. I will call her back by beginning of next week. She verbalized understanding.

## 2015-07-29 NOTE — Telephone Encounter (Signed)
-----   Message from Melvenia Beam, MD sent at 07/28/2015 10:14 PM EDT ----- Let patient know there is no aneurysm. thanks

## 2015-08-01 NOTE — Telephone Encounter (Signed)
MRi of the brain is next if she can tolerate it. I can order it under the open mri. Ask her and let me know. thanks

## 2015-08-02 NOTE — Telephone Encounter (Signed)
Called pt and relayed Dr Jaynee Eagles message below. She stated the CT report was wrong. She did not have the two falls that were mentioned on specific dates. I checked and advised they had put an addendum note in stating that she stated she did not have falls mentioned. She verbalized understanding. She cannot have before April 19th because of funding. I advised I will let Dr Jaynee Eagles know. Dr Jaynee Eagles out of office today. We will place order tomorrow and someone will call to schedule this with her.

## 2015-08-02 NOTE — Telephone Encounter (Signed)
Patient called back, "requests not to be scheduled at [facility], will go anywhere that insurance covers except [place]".

## 2015-08-03 NOTE — Telephone Encounter (Signed)
Dr Jaynee Eagles- please order open MRI, thank you

## 2015-08-03 NOTE — Addendum Note (Signed)
Addended by: Sarina Ill B on: 08/03/2015 04:52 PM   Modules accepted: Orders

## 2015-08-05 NOTE — Telephone Encounter (Signed)
Patient called requesting for Michelle Villarreal to call her back. Patient states she has the detailed results of CT Angio of head that was done on 07/28/15. Would like to know, what Dr. Jaynee Eagles would be able to see on MRI that she can't see on CT? States these copay's are costing her a lot of money. Please call [phone].

## 2015-08-05 NOTE — Telephone Encounter (Signed)
Dr Jaynee Eagles- please advise  Called pt back. She has copay to pay form ER visit and from CT scan. She cannot afford MRI based on her income. She stated Decadron and medrol dose pak helped. Wondering what MRI would show that CT did not. Explained MRI more sensitive of a test and will sometimes show things that a CT scan will not. I spoke to Dr Jaynee Eagles while pt on phone. She advised if pt cannot afford at this time, she can hold off on test and we will follow her clinically and treat her headache. She stated her veins are pulsating behind her ears and wondering if she should come in for another appt or if another medrol dose pak will help. Advised since she was just in last week, I will talk to Dr Jaynee Eagles first and call her back to advise. She verbalized understanding.

## 2015-08-05 NOTE — Telephone Encounter (Signed)
Pt wanted to let Dr. Jaynee Eagles know she is going to have the MRI done at [facility].

## 2015-08-08 ENCOUNTER — Telehealth: Payer: Self-pay | Admitting: Neurology

## 2015-08-08 NOTE — Telephone Encounter (Signed)
Dr Ahern- please advise 

## 2015-08-08 NOTE — Telephone Encounter (Signed)
Pt called regarding tinnitis. She had severe pain in the ear last night, had very sleep and today she is sleepy, irritable. She said the pounding, pain in the rt temple is severe. She took aleve at 2am and tylenol 1000mg  about 9:30 with no relief.

## 2015-08-08 NOTE — Telephone Encounter (Signed)
Spoke to patient, she has a tooth on the right where the filling came out about a year ago and when she eats food gets stuck up there. She thinks she may have some periodontal disease and it is tender right where the tooth is around there. This is where the throbbing is. I advised her to go to the dentist. I have also ordered an MRI of the brain. I offered to have her come in for trigger injections or we can try a migraine cocktail tomorrow if she wants.   Emma, please put her on the schedule for 12:30. Asked her to arrive at 12:15. I will do some injections tomorrow thanks.

## 2015-08-09 ENCOUNTER — Ambulatory Visit (INDEPENDENT_AMBULATORY_CARE_PROVIDER_SITE_OTHER): Payer: Medicare Other | Admitting: Neurology

## 2015-08-09 VITALS — BP 145/95 | HR 86 | Ht 69.5 in | Wt 238.4 lb

## 2015-08-09 DIAGNOSIS — M5481 Occipital neuralgia: Secondary | ICD-10-CM | POA: Diagnosis not present

## 2015-08-09 MED ORDER — METHYLPREDNISOLONE 4 MG PO TBPK: ORAL_TABLET | ORAL | Status: DC

## 2015-08-09 NOTE — Telephone Encounter (Signed)
Placed pt on schedule today at 1230pm per Dr Jaynee Eagles request.

## 2015-08-09 NOTE — Progress Notes (Addendum)
[facility]    Provider:  Dr Jaynee Eagles Referring Provider: Seward Carol, MD Primary Care Physician:  Kandice Hams, MD  CC: Pulsating and pounding in ears, right-sided occipital pain  Interval history 08/09/2015: She is still having pain in the right side of the posterior head. She has pounding, throbbing, severe. It is radiating from the right occipital area, tender, and into the temple. The pain has been continuous. Worse when she wakes up in the morning. Discussed the pain could be occipital neuralgia. Showed patient images of the occipital nerve. We will order an MRI of the brain. The CTA of the head was unremarkable. Reviewed CTA findings. Will perform trigger point injections today.   HPI: Michelle Villarreal is a 70 y.o. female here as a referral from Dr. Delfina Redwood and Dr. Ernesto Rutherford for right ear booming tinnitus. Past medical history of hypertension, fall, chronic pain, osteoarthritis, neuropathy, diabetes, cataracts fibromyalgia. Murmur started in the right ear. It went away then started booming, pounding, Sounds like blood flowing through a vessel. She had a fall Nov 1st and symptoms started 3 weeks afterwards but she does not think this had anything to do with it, she did not hit her head. Sometimes it is soft other times is unbelieveble. Only in the right ear. Sometimes it is roaring, sometimes like a banjo and her ear vibrates along with it and the pressure is severe. Started 2 months ago but in the last week have increased in severity. The pain behind the ear is new about a week. It is so loud it wakes her up in the middle of the night. Just in the right ear. Worse at night. Comes and goes during the day. Has tried alleve, tylenol doesn't help. Doesn't drink caffeine. She has tried lots of things, nothing helps. She is on a medrol dosepak now. She was given Decadron. No PMHx of headaches or migraines. May get a headache with blood pressure or with dehydration but rare. But  until a week ago it was just noise,vibration and no pain. A week ago started feeling pressure in the ear and pain behind the ear. The pain behind the ear is tenderness, painful, not tingly, not electric. Tenderness behind the left ear and in the temples. Has also had stabbing pain through the ear then it went away. She also had a fall the last day of January at the same spot, didn't hit her head. She cannot tolerate the MRI.   Reviewed notes, labs and imaging from outside physicians, which showed:  Labs were drawn 06/08/2015: CMP normal creatinine of 0.76. TSH 1.75.  Patient was evaluated by Dr. Ernesto Rutherford. She came with a complaint of right ear booming tinnitus related to her pulse. ENT evaluation reveals both ears to be completely clear after he remove cerumen from the right ear, tympanic membranes move well, no evidence of fluid behind the tympanic membrane. Nose is completely clear. Larynx is clear. True cords, false cord, epiglottis, base of tongue, lateral pharyngeal walls are clear, and she cord mobility, Reflex, tongue mobility, extraocular movements, facial nerve are all symmetrical. Neck is free of thyromegaly, cervical adenopathy or masses. Trachea midline. No evidence of bruit. No cardiac arrhythmias. No history of cardiac problem. Oriented 3. Diagnosis is possible vertigo, she has a history of orthostatic hypotension. Plan is for hearing test attempted MRI of the brain patient did not tolerate. On audiogram, she has a 15 dB speech reception threshold on both sides and in 96 and 100% discrimination score.  Review  of Systems: Patient complains of symptoms per HPI as well as the following symptoms: Headache, insomnia, depression, joint pain, joint swelling, incontinence. Pertinent negatives per HPI. All others negative.    Social History   Social History  . Marital Status: Single    Spouse Name: N/A  . Number of Children: N/A  . Years of Education: N/A   Occupational History  . Not on  file.   Social History Main Topics  . Smoking status: Former Research scientist (life sciences)  . Smokeless tobacco: Not on file  . Alcohol Use: Yes  . Drug Use: No  . Sexual Activity: No   Other Topics Concern  . Not on file   Social History Narrative   Lives alone   Caffeine use: minimal     Family History  Problem Relation Age of Onset  . Stroke Neg Hx   . Migraines Neg Hx   . Ataxia Neg Hx     Past Medical History  Diagnosis Date  . Hypertension   . Tinnitus   . Fall     09/30/13, 03/15/15, 06/14/15  . Chronic pain   . Osteoarthritis   . Neuropathy ([place])   . Diabetes ([place])   . Bladder incontinence   . Adenoma     adrenal gland  . Cataracts, bilateral   . Fibromyalgia     Past Surgical History  Procedure Laterality Date  . Replacement total knee Bilateral 2003/2004  . Breast reduction surgery    . Vaginal hysterectomy    . Bunionectomy    . Tubal ligation      Current Outpatient Prescriptions  Medication Sig Dispense Refill  . carvedilol (COREG) 12.5 MG tablet Take 12.5 mg by mouth 2 (two) times daily.  4  . cholecalciferol (VITAMIN D) 1000 UNITS tablet Take 1,000 Units by mouth daily.    . naproxen sodium (ANAPROX) 220 MG tablet Take 220 mg by mouth 2 (two) times daily with a meal.    . vitamin E 400 UNIT capsule Take 400-800 Units by mouth every morning.     No current facility-administered medications for this visit.    Allergies as of 08/09/2015 - Review Complete 08/09/2015  Allergen Reaction Noted  . Ultram [tramadol] Shortness Of Breath 01/25/2013  . Arixtra [fondaparinux] Other (See Comments) 01/25/2013  . Benzodiazepines Other (See Comments) 01/25/2013  . Codeine Itching 01/25/2013  . Demerol [meperidine] Nausea Only 01/25/2013  . Gabapentin Other (See Comments) 01/25/2013  . Lyrica [pregabalin] Other (See Comments) 01/25/2013  . Morphine and related Hives 01/25/2013  . Zoloft [sertraline hcl] Other (See Comments) 01/25/2013    Vitals: BP 145/95 mmHg  Pulse  86  Ht 5' 9.5" (1.765 m)  Wt 238 lb 6.4 oz (108.138 kg)  BMI 34.71 kg/m2 Last Weight:  Wt Readings from Last 1 Encounters:  08/09/15 238 lb 6.4 oz (108.138 kg)   Last Height:   Ht Readings from Last 1 Encounters:  08/09/15 5' 9.5" (1.765 m)     Physical exam: Exam: Gen: NAD, conversant, well nourised, obese, well groomed  CV: RRR, no MRG. No Carotid Bruits. No peripheral edema, warm, nontender Eyes: Conjunctivae clear without exudates or hemorrhage  Neuro: Detailed Neurologic Exam  Speech:  Speech is normal; fluent and spontaneous with normal comprehension.  Cognition:  The patient is oriented to person, place, and time;   recent and remote memory intact;   language fluent;   normal attention, concentration,   fund of knowledge Cranial Nerves:  The pupils are equal, round, and  reactive to light. The fundi are normal and spontaneous venous pulsations are present. Visual fields are full to finger confrontation. Extraocular movements are intact. Trigeminal sensation is intact and the muscles of mastication are normal. The face is symmetric. The palate elevates in the midline. Hearing intact. Voice is normal. Shoulder shrug is normal. The tongue has normal motion without fasciculations.   Coordination:  Normal finger to nose and heel to shin. Normal rapid alternating movements.   Gait:  Heel-toe and tandem gait are normal.   Motor Observation:  No asymmetry, no atrophy, and no involuntary movements noted. Tone:  Normal muscle tone.   Posture:  Posture is normal. normal erect   Strength:  Strength is V/V in the upper and lower limbs.    Sensation: intact to LT   Reflex Exam:  DTR's: Patellars surgical otherwise deep tendon reflexes in the upper and lower extremities are normal bilaterally.  Toes:  The toes are downgoing bilaterally.  Clonus:  Clonus is absent.      Assessment/Plan:  70 year old female here for 2 months abnormal hearing in the right ear. She describes pulsatile tinnitus, loud noises such as a murmur, booming sound like blood flowing through the vessel. Very concerning for aneurysm. We'll send her for CTA of the head was unremarkable. She is reporting occipital pain with radiation. Will perform an occipital nerve block today. MRI of the brain pending.    NERVE BLOCK PROCEDURE NOTE for occipital neuralgia Procedure: Patient was consented for right trigger point injections. A solution containing 0.5% 5mg /ml Bupivacaine 1-cc and 80mg  Depo Medrol 1cc and 2% lidocaine 1cc was prepared in 3-CC syringe with 30 gauge 1/2 inch needle.   3 Target areas in the occipital, suboccipital, paraspinal regions were identified via palpation and pain response.The sites junctions were sterilized with alcohol wipes. The contents of each syringe was injected in a fanlike fashion. The headache improved from 6/10 to 0/10. Patient tolerated the procedure well and no complications were noted.   Consent was provided below and patient acknowledged understanding:  Lidocaine 2%  Lot: [id number]  Expiration 03/2019  NDC: [id number]   Depo-medrol 80mg /mL  Lot: [id number]  Expiration: 06/2016  NDC: [id number]   Marcaine 0.5%  Lot: [id number]  Expiration: 12/12/2016  NDC: [id number]   What to expect afterwards?  Immediately after the injection, the back of your head may feel warm and numb. You may also experience reduction in the pain. The local anaesthetic wears off in a few hours and the steroid usually takes  3-7 days to take effect.  The pain relief is vary variable and can last from a few days to several months. Some patients do not experience any pain relief. Hence it is difficult to predict the outcome of the injection treatment in a particular patient.  There may be some discomfort at the injection site for a couple of days after treatment, however, this should settle quite  quickly. We advise you to take things easy for the rest of the day. Continue taking your pain medication as advised by your consultant or until you feel benefit from the treatment.  What are the side effects / complications?  Common   Soreness / bruising at the injection site.   Temporary increase (up to 7 days) in pain following procedure.  Rare   Bleeding   Infection at the injection site   Allergic reaction   New pain   Worsening pain     Sarina Ill, MD  [facility] [phone]  [place], [place] [address]  Phone [phone] Fax [phone]  A total of 25 minutes was spent face-to-face with this patient. Over half this time was spent on counseling patient on the occipital neuralgia diagnosis and different diagnostic and therapeutic options available.

## 2015-08-09 NOTE — Patient Instructions (Signed)
Remember to drink plenty of fluid, eat healthy meals and do not skip any meals. Try to eat protein with a every meal and eat a healthy snack such as fruit or nuts in between meals. Try to keep a regular sleep-wake schedule and try to exercise daily, particularly in the form of walking, 20-30 minutes a day, if you can.   As far as your medications are concerned, I would like to suggest: Medrol dosepak  As far as diagnostic testing: MRI of the brain  Occipital neuralgia?  Our phone number is [phone]. We also have an after hours call service for urgent matters and there is a physician on-call for urgent questions. For any emergencies you know to call 911 or go to the nearest emergency room

## 2015-08-09 NOTE — Progress Notes (Signed)
Lidocaine 2%- 2.5 ml total used Lot: [id number] Expiration 03/2018 NDC: [id number]  Depo-medrol 80mg /mL- 1 vial Lot: [id number] Expiration: 06/2016 NDC: [id number]  Marcaine 0.5%- 2.5 mL total used Lot: [id number] Expiration: 12/12/2016 NDC: [id number]

## 2015-08-11 ENCOUNTER — Encounter: Payer: Self-pay | Admitting: Neurology

## 2015-08-11 ENCOUNTER — Ambulatory Visit
Admission: RE | Admit: 2015-08-11 | Discharge: 2015-08-11 | Disposition: A | Discharge status: Home / Self Care | Payer: Medicare Other | Source: Ambulatory Visit | Attending: Neurology | Admitting: Neurology

## 2015-08-11 DIAGNOSIS — R519 Headache, unspecified: Secondary | ICD-10-CM

## 2015-08-11 DIAGNOSIS — H93A1 Pulsatile tinnitus, right ear: Secondary | ICD-10-CM | POA: Diagnosis not present

## 2015-08-11 DIAGNOSIS — I10 Essential (primary) hypertension: Secondary | ICD-10-CM

## 2015-08-11 DIAGNOSIS — R51 Headache: Secondary | ICD-10-CM

## 2015-08-11 DIAGNOSIS — M797 Fibromyalgia: Secondary | ICD-10-CM

## 2015-08-11 DIAGNOSIS — G609 Hereditary and idiopathic neuropathy, unspecified: Secondary | ICD-10-CM

## 2015-08-11 MED ORDER — GADOBENATE DIMEGLUMINE 529 MG/ML IV SOLN
20.0000 mL | Freq: Once | INTRAVENOUS | Status: AC | PRN
  Administered 2015-08-11: 20 mL via INTRAVENOUS

## 2015-08-15 ENCOUNTER — Other Ambulatory Visit: Payer: Self-pay | Admitting: Neurology

## 2015-08-15 ENCOUNTER — Telehealth: Payer: Self-pay | Admitting: *Deleted

## 2015-08-15 MED ORDER — ACETAZOLAMIDE 250 MG PO TABS: 250.0000 mg | ORAL_TABLET | Freq: Two times a day (BID) | ORAL | Status: DC

## 2015-08-15 NOTE — Telephone Encounter (Signed)
Spoke with patient. MRI of the brain and CTA of the head did not show any etiology for her headache and the increased noises in her right ear. The pain in the right-sided occipital area is improved after the nerve blocks. Now she just has a lot of pressure in the ear and popping and lots of noises like a train in her right ear, all sorts of sounds in the right ear like once it sounded like a base guitar in her right ear, no ringing but does hear swishing. Discussed increased intracranial pressure may cause noises in the ear but not sure why it would only be in one ear if it was IIH. She was evaluated by ENT and no ear causes found. She declined LP for opening pressure and says she would like to see what happens over the next mont, could also try low-dose Diamox and see if that helps. Will try Diamox. She will call us around easter. I did say if it gets worse or she needs to come see [place] we are happy to see her any time. She should also follow with ENT again. She should also watch her blood pressure as this can cause headache.  Discussed side effects of diamox. Serious side effects can inclue: Abdominal or stomach pain ,fever, chills, or sore throat , lessening of sensations or perception ,loss of appetite , mood or mental changes, including aggression, agitation, apathy, irritability, and mental depression , red, irritated, or bleeding gums , weight loss Rare Blood in the urine , decrease in sexual performance or desire , difficult or painful urination , frequent urination , hearing loss , loss of bladder control , lower back or side pain , nosebleeds , pale skin , red or irritated eyes , ringing or buzzing in the ears , skin rash or itching , swelling , trouble breathing, Common side effects of Topamax include:  tiredness, drowsiness, dizziness, nervousness, numbness or tingly feeling, coordination problems, diarrhea, weight loss, speech/language problems, changes in vision, sensory distortion, loss of appetite,  bad taste in your mouth, confusion, slowed thinking, trouble concentrating or paying attention, memory problems,

## 2015-08-15 NOTE — Telephone Encounter (Signed)
Called pt and spoke to her about MRI results per Dr Jaynee Eagles. She verbalized understanding. She stated her short term memory has been "horrible". Pain behind ear has decreased. States her hearing has decreased. Pain at top on head still. Not sure if it is stress causing this or not. When she went to have MRI done, she left her folder that she writes everything down on. She remembered when she got home after testing. She is very "frustrated". Advised I will speak to Dr Jaynee Eagles and call her back to advise by the end of the end. She verbalized understanding.

## 2015-08-15 NOTE — Telephone Encounter (Signed)
-----   Message from Melvenia Beam, MD sent at 08/13/2015  5:56 PM EDT ----- MRI of the brain is normal for age. thanks

## 2015-08-31 ENCOUNTER — Other Ambulatory Visit: Payer: Medicare Other

## 2015-09-01 ENCOUNTER — Telehealth: Payer: Self-pay | Admitting: Neurology

## 2015-09-01 NOTE — Telephone Encounter (Signed)
Patient called to advise, she had reaction to DIAMOX, states right ear noises still persist, will forego LP. Also says to tell Dr. Jaynee Eagles "Have a safe and pleasant vacation Dr. Loni Muse, will contact prn".

## 2015-12-27 ENCOUNTER — Telehealth: Payer: Self-pay | Admitting: Neurology

## 2015-12-27 NOTE — Telephone Encounter (Signed)
Dr Ahern- FYI 

## 2015-12-27 NOTE — Telephone Encounter (Signed)
Patient reports new memory loss for the past 4-5 mths. She is concerned she can't remember what she read 5 min before.  An appointment was made for tomorrow 8/16 @ 11. Advised that the nurse would call if there was any other questions.

## 2015-12-28 ENCOUNTER — Ambulatory Visit (INDEPENDENT_AMBULATORY_CARE_PROVIDER_SITE_OTHER): Payer: Medicare Other | Admitting: Neurology

## 2015-12-28 ENCOUNTER — Encounter: Payer: Self-pay | Admitting: Neurology

## 2015-12-28 VITALS — BP 138/85 | HR 71 | Ht 69.5 in | Wt 242.0 lb

## 2015-12-28 DIAGNOSIS — E538 Deficiency of other specified B group vitamins: Secondary | ICD-10-CM | POA: Diagnosis not present

## 2015-12-28 DIAGNOSIS — R413 Other amnesia: Secondary | ICD-10-CM

## 2015-12-28 NOTE — Patient Instructions (Addendum)
Remember to drink plenty of fluid, eat healthy meals and do not skip any meals. Try to eat protein with a every meal and eat a healthy snack such as fruit or nuts in between meals. Try to keep a regular sleep-wake schedule and try to exercise daily, particularly in the form of walking, 20-30 minutes a day, if you can.   As far as your medications are concerned, I would like to suggest Suggest Aricept if neuropsychiatric testing shows any memory changes  Recommend neurocognitive testing PET FDG CT Scan  I would like to see you back after neurocognitive testing we will call you, sooner if we need to. Please call us with any interim questions, concerns, problems, updates or refill requests.   Our phone number is [phone]. We also have an after hours call service for urgent matters and there is a physician on-call for urgent questions. For any emergencies you know to call 911 or go to the nearest emergency room

## 2015-12-28 NOTE — Progress Notes (Signed)
[facility]    Provider:  Dr Jaynee Eagles Referring Provider: Seward Carol, MD Primary Care Physician:  Kandice Hams, MD   CC: Memory loss  Interval history 12/28/2015: Patient is here for a new problem, memory loss for 4-5 months. Patient is having changes in short-term memory. She Is forgetting things. When she had nerve blocks with me she couldn't remember signing the consent and came back for a copy. Can't rememebr how to spell simple words. Using the wrong words in texts and leaving words off. Has left the stove on 3x since being in here. She forgot her pin. She stopped her blood pressure medications and the symptoms of her right ear improved. No Fhx of alzheimers however parents died young. Symptoms started around November of last year with noise in her ear. Progressively worsening. She is very tired during the day. She has multiple medical problems per patient such as hip and bacl pain and knee paon. Shs is under a lot of stress. She has significant pain in her hips. She is anxious, she is worried that she has early-onset dementia and no one will take care of her. She hs neuropathy which hurts her in bed, wrse at night, not sleeping well, hip and back pain. Progressive memory changes. Confusion.   MRI of the brain 07/2015:  Slightly abnormal MRI scan of the brain showing mild changes of chronic microvascular ischemia. No structural lesions are noted.  Interval history 08/09/2015: She is still having pain in the right side of the posterior head. She has pounding, throbbing, severe. It is radiating from the right occipital area, tender, and into the temple. The pain has been continuous. Worse when she wakes up in the morning. Discussed the pain could be occipital neuralgia. Showed patient images of the occipital nerve. We will order an MRI of the brain. The CTA of the head was unremarkable. Reviewed CTA findings. Will perform trigger point injections today.   HPI: Michelle Villarreal is a 70 y.o. female here as a referral from Dr. Delfina Redwood and Dr. Ernesto Rutherford for right ear booming tinnitus. Past medical history of hypertension, fall, chronic pain, osteoarthritis, neuropathy, diabetes, cataracts fibromyalgia. Murmur started in the right ear. It went away then started booming, pounding, Sounds like blood flowing through a vessel. She had a fall Nov 1st and symptoms started 3 weeks afterwards but she does not think this had anything to do with it, she did not hit her head. Sometimes it is soft other times is unbelieveble. Only in the right ear. Sometimes it is roaring, sometimes like a banjo and her ear vibrates along with it and the pressure is severe. Started 2 months ago but in the last week have increased in severity. The pain behind the ear is new about a week. It is so loud it wakes her up in the middle of the night. Just in the right ear. Worse at night. Comes and goes during the day. Has tried alleve, tylenol doesn't help. Doesn't drink caffeine. She has tried lots of things, nothing helps. She is on a medrol dosepak now. She was given Decadron. No PMHx of headaches or migraines. May get a headache with blood pressure or with dehydration but rare. But until a week ago it was just noise,vibration and no pain. A week ago started feeling pressure in the ear and pain behind the ear. The pain behind the ear is tenderness, painful, not tingly, not electric. Tenderness behind the left ear and in the temples. Has also  had stabbing pain through the ear then it went away. She also had a fall the last day of January at the same spot, didn't hit her head. She cannot tolerate the MRI.   Reviewed notes, labs and imaging from outside physicians, which showed:  Labs were drawn 06/08/2015: CMP normal creatinine of 0.76. TSH 1.75.  Patient was evaluated by Dr. Ernesto Rutherford. She came with a complaint of right ear booming tinnitus related to her pulse. ENT evaluation reveals both ears to be completely  clear after he remove cerumen from the right ear, tympanic membranes move well, no evidence of fluid behind the tympanic membrane. Nose is completely clear. Larynx is clear. True cords, false cord, epiglottis, base of tongue, lateral pharyngeal walls are clear, and she cord mobility, Reflex, tongue mobility, extraocular movements, facial nerve are all symmetrical. Neck is free of thyromegaly, cervical adenopathy or masses. Trachea midline. No evidence of bruit. No cardiac arrhythmias. No history of cardiac problem. Oriented 3. Diagnosis is possible vertigo, she has a history of orthostatic hypotension. Plan is for hearing test attempted MRI of the brain patient did not tolerate. On audiogram, she has a 15 dB speech reception threshold on both sides and in 96 and 100% discrimination score.  Review of Systems: Patient complains of symptoms per HPI as well as the following symptoms: Headache, insomnia, depression, joint pain, joint swelling, incontinence. Pertinent negatives per HPI. All others negative.   Social History   Social History  . Marital status: Single    Spouse name: N/A  . Number of children: N/A  . Years of education: N/A   Occupational History  . Not on file.   Social History Main Topics  . Smoking status: Former Research scientist (life sciences)  . Smokeless tobacco: Not on file  . Alcohol use Yes  . Drug use: No  . Sexual activity: No   Other Topics Concern  . Not on file   Social History Narrative   Lives alone   Caffeine use: minimal     Family History  Problem Relation Age of Onset  . Stroke Neg Hx   . Migraines Neg Hx   . Ataxia Neg Hx     Past Medical History:  Diagnosis Date  . Adenoma    adrenal gland  . Bladder incontinence   . Cataracts, bilateral   . Chronic pain   . Diabetes ([place])   . Fall    09/30/13, 03/15/15, 06/14/15  . Fibromyalgia   . Hypertension   . Neuropathy ([place])   . Osteoarthritis   . Tinnitus     Past Surgical History:  Procedure Laterality Date    . BREAST REDUCTION SURGERY    . BUNIONECTOMY    . REPLACEMENT TOTAL KNEE Bilateral 2003/2004  . TUBAL LIGATION    . VAGINAL HYSTERECTOMY      Current Outpatient Prescriptions  Medication Sig Dispense Refill  . acetaZOLAMIDE (DIAMOX) 250 MG tablet Take 1 tablet (250 mg total) by mouth 2 (two) times daily. 60 tablet 12  . carvedilol (COREG) 12.5 MG tablet Take 12.5 mg by mouth 2 (two) times daily.  4  . cholecalciferol (VITAMIN D) 1000 UNITS tablet Take 1,000 Units by mouth daily.    . methylPREDNISolone (MEDROL DOSEPAK) 4 MG TBPK tablet follow package directions 21 tablet 1  . naproxen sodium (ANAPROX) 220 MG tablet Take 220 mg by mouth 2 (two) times daily with a meal.    . vitamin E 400 UNIT capsule Take 400-800 Units by mouth every morning.  No current facility-administered medications for this visit.     Allergies as of 12/28/2015 - Review Complete 12/28/2015  Allergen Reaction Noted  . Ultram [tramadol] Shortness Of Breath 01/25/2013  . Arixtra [fondaparinux] Other (See Comments) 01/25/2013  . Benzodiazepines Other (See Comments) 01/25/2013  . Codeine Itching 01/25/2013  . Demerol [meperidine] Nausea Only 01/25/2013  . Gabapentin Other (See Comments) 01/25/2013  . Lyrica [pregabalin] Other (See Comments) 01/25/2013  . Morphine and related Hives 01/25/2013  . Zoloft [sertraline hcl] Other (See Comments) 01/25/2013    Vitals: There were no vitals taken for this visit. Last Weight:  Wt Readings from Last 1 Encounters:  08/11/15 238 lb (108 kg)   Last Height:   Ht Readings from Last 1 Encounters:  08/09/15 5' 9.5" (1.765 m)    Physical exam: Exam: Gen: NAD, conversant, well nourised, obese, well groomed  CV: RRR, no MRG. No Carotid Bruits. No peripheral edema, warm, nontender Eyes: Conjunctivae clear without exudates or hemorrhage  Neuro: Detailed Neurologic Exam  Speech:  Speech is normal; fluent and spontaneous with normal  comprehension.  Cognition:  The patient is oriented to person, place, and time;   recent and remote memory intact;   language fluent;   normal attention, concentration,   fund of knowledge  MMSE - Mini Mental State Exam 12/28/2015  Orientation to time 5  Orientation to Place 5  Registration 3  Attention/ Calculation 5  Recall 2  Language- name 2 objects 2  Language- repeat 1  Language- follow 3 step command 3  Language- read & follow direction 1  Write a sentence 1  Copy design 1  Total score 29   Cranial Nerves:  The pupils are equal, round, and reactive to light. The fundi are normal and spontaneous venous pulsations are present. Visual fields are full to finger confrontation. Extraocular movements are intact. Trigeminal sensation is intact and the muscles of mastication are normal. The face is symmetric. The palate elevates in the midline. Hearing intact. Voice is normal. Shoulder shrug is normal. The tongue has normal motion without fasciculations.   Coordination:  Normal finger to nose and heel to shin. Normal rapid alternating movements.   Gait:  Heel-toe and tandem gait are normal.   Motor Observation:  No asymmetry, no atrophy, and no involuntary movements noted. Tone:  Normal muscle tone.   Posture:  Posture is normal. normal erect   Strength:  Strength is V/V in the upper and lower limbs.    Sensation: intact to LT   Reflex Exam:  DTR's: Patellars surgical otherwise deep tendon reflexes in the upper and lower extremities are normal bilaterally.  Toes:  The toes are downgoing bilaterally.  Clonus:  Clonus is absent.      Assessment/Plan: 70 year old female here for 4-5 months of progressive memory change in the setting of chronic pain and stress  Memory loss: may be multifactorial due to normal aging, chronic pain, stress. MMSE 29/30. Needs formal neurocognitive testing, prefers to wait until  after the FDG pet scan Would like an FDG PET scan due to progressive memory changes per patient  CC: Dr. Lianne Cure, [facility] [address] [place], [place] [address]  Phone [phone] Fax [phone]  A total of 30 minutes was spent face-to-face with this patient. Over half this time was spent on counseling patient on the cognitive changes diagnosis and different diagnostic and therapeutic options available.

## 2015-12-29 ENCOUNTER — Telehealth: Payer: Self-pay | Admitting: *Deleted

## 2015-12-29 LAB — THYROID PANEL WITH TSH
FREE THYROXINE INDEX: 2.3 (ref 1.2–4.9)
T3 Uptake Ratio: 26 % (ref 24–39)
T4, Total: 8.9 ug/dL (ref 4.5–12.0)
TSH: 2.15 u[IU]/mL (ref 0.450–4.500)

## 2015-12-29 LAB — B12 AND FOLATE PANEL
FOLATE: 8.6 ng/mL (ref >3.0)
VITAMIN B 12: 1729 pg/mL — AB (ref 211–946)

## 2015-12-29 LAB — RPR: RPR Ser Ql: NONREACTIVE

## 2015-12-29 NOTE — Telephone Encounter (Signed)
-----   Message from Melvenia Beam, MD sent at 12/29/2015 10:01 AM EDT ----- Labs are all fine thanks

## 2015-12-29 NOTE — Telephone Encounter (Signed)
Called and spoke to pt about lab results per Dr Jaynee Eagles. She verbalized understanding. She states she is having to move twice, moving things next week into storage and then moving again in November. She may not be able to come for f/u for a while and she has to see if she can do testing or not.  She was in contact with her insurance company. Advised her to keep [place] up to date on what she can/cannot do. She verbalized understanding.

## 2015-12-30 ENCOUNTER — Telehealth: Payer: Self-pay | Admitting: Neurology

## 2015-12-30 NOTE — Telephone Encounter (Signed)
Called and lef message to Set up apt for PET scan [phone] I asked for return call to [phone]. Thanks [name]

## 2016-01-03 ENCOUNTER — Telehealth: Payer: Self-pay | Admitting: Neurology

## 2016-01-03 NOTE — Telephone Encounter (Signed)
Per [facility] PET scan order needs to be changed .  If Patient has had a PET scan before order needs to say PET scan initial skull base  to thigh.  If patient has not had PET scan before order needs to say PET scan restaging Skull base to thigh.

## 2016-01-03 NOTE — Telephone Encounter (Signed)
Same issue, this PET scan is for dementia should FDG PET Scan did I order it wrong?

## 2016-01-03 NOTE — Telephone Encounter (Signed)
Dr. Jaynee Eagles order does not need to be changed scheduler at [facility] looked at Wrong .  Apt at [facility] had her scheduled at [facility] scheduled August 29 th arrive at 6:30  For a 7:00. Am apt.   When I spoke to patient she refuses to go to [facility] or [facility] . Patient is holding off on PET Scan now. Patient relayed she is moving and she will call Dr. Jaynee Eagles in Sept or October. Patient was very strong about No she is not going

## 2016-01-04 NOTE — Telephone Encounter (Signed)
Ok thanks cancel appointment

## 2016-01-09 ENCOUNTER — Encounter (HOSPITAL_COMMUNITY): Payer: Medicare Other

## 2016-01-10 ENCOUNTER — Encounter (HOSPITAL_COMMUNITY): Payer: Medicare Other

## 2016-05-01 ENCOUNTER — Other Ambulatory Visit: Payer: Self-pay | Admitting: Internal Medicine

## 2016-05-01 DIAGNOSIS — N644 Mastodynia: Secondary | ICD-10-CM

## 2017-04-09 ENCOUNTER — Other Ambulatory Visit: Payer: Self-pay | Admitting: Internal Medicine

## 2017-04-09 DIAGNOSIS — N6452 Nipple discharge: Secondary | ICD-10-CM

## 2017-04-09 DIAGNOSIS — N644 Mastodynia: Secondary | ICD-10-CM

## 2017-05-01 ENCOUNTER — Other Ambulatory Visit: Payer: Medicare Other

## 2017-05-06 ENCOUNTER — Emergency Department (HOSPITAL_COMMUNITY): Payer: Medicare Other

## 2017-05-06 ENCOUNTER — Encounter (HOSPITAL_COMMUNITY): Payer: Self-pay | Admitting: Neurology

## 2017-05-06 ENCOUNTER — Emergency Department (HOSPITAL_COMMUNITY)
Admission: EM | Admit: 2017-05-06 | Discharge: 2017-05-06 | Disposition: A | Discharge status: Home / Self Care | Payer: Medicare Other | Attending: Emergency Medicine | Admitting: Emergency Medicine

## 2017-05-06 DIAGNOSIS — B9789 Other viral agents as the cause of diseases classified elsewhere: Secondary | ICD-10-CM | POA: Diagnosis not present

## 2017-05-06 DIAGNOSIS — Z87891 Personal history of nicotine dependence: Secondary | ICD-10-CM | POA: Diagnosis not present

## 2017-05-06 DIAGNOSIS — Z96653 Presence of artificial knee joint, bilateral: Secondary | ICD-10-CM | POA: Insufficient documentation

## 2017-05-06 DIAGNOSIS — R05 Cough: Secondary | ICD-10-CM | POA: Diagnosis present

## 2017-05-06 DIAGNOSIS — J069 Acute upper respiratory infection, unspecified: Secondary | ICD-10-CM | POA: Diagnosis not present

## 2017-05-06 DIAGNOSIS — Z79899 Other long term (current) drug therapy: Secondary | ICD-10-CM | POA: Insufficient documentation

## 2017-05-06 DIAGNOSIS — I10 Essential (primary) hypertension: Secondary | ICD-10-CM | POA: Insufficient documentation

## 2017-05-06 LAB — CBC WITH DIFFERENTIAL/PLATELET
BASOS ABS: 0 10*3/uL (ref 0.0–0.1)
Basophils Relative: 0 %
EOS ABS: 0.3 10*3/uL (ref 0.0–0.7)
Eosinophils Relative: 5 %
HEMATOCRIT: 42.5 % (ref 36.0–46.0)
Hemoglobin: 13.8 g/dL (ref 12.0–15.0)
Lymphocytes Relative: 30 %
Lymphs Abs: 2 10*3/uL (ref 0.7–4.0)
MCH: 30.9 pg (ref 26.0–34.0)
MCHC: 32.5 g/dL (ref 30.0–36.0)
MCV: 95.3 fL (ref 78.0–100.0)
MONO ABS: 0.4 10*3/uL (ref 0.1–1.0)
Monocytes Relative: 6 %
NEUTROS ABS: 3.8 10*3/uL (ref 1.7–7.7)
Neutrophils Relative %: 59 %
PLATELETS: 175 10*3/uL (ref 150–400)
RBC: 4.46 MIL/uL (ref 3.87–5.11)
RDW: 13.9 % (ref 11.5–15.5)
WBC: 6.5 10*3/uL (ref 4.0–10.5)

## 2017-05-06 LAB — BASIC METABOLIC PANEL
ANION GAP: 7 (ref 5–15)
BUN: 12 mg/dL (ref 6–20)
CALCIUM: 9.7 mg/dL (ref 8.9–10.3)
CO2: 29 mmol/L (ref 22–32)
CREATININE: 0.69 mg/dL (ref 0.44–1.00)
Chloride: 105 mmol/L (ref 101–111)
Glucose, Bld: 104 mg/dL — ABNORMAL HIGH (ref 65–99)
Potassium: 4.1 mmol/L (ref 3.5–5.1)
SODIUM: 141 mmol/L (ref 135–145)

## 2017-05-06 LAB — I-STAT TROPONIN, ED: TROPONIN I, POC: 0 ng/mL (ref 0.00–0.08)

## 2017-05-06 MED ORDER — BENZONATATE 100 MG PO CAPS: 100.0000 mg | ORAL_CAPSULE | Freq: Three times a day (TID) | ORAL | 0 refills | Status: DC

## 2017-05-06 NOTE — ED Triage Notes (Addendum)
Pt reports cough, runny nose, body aches x 1 week. Worse at night, unsure if she had fever. Is taking tylenol and aspirin. Constant mucus, gray color and foul odor. 100% RA. Feels "something is sitting on her chest" from coughing.

## 2017-05-06 NOTE — Discharge Instructions (Signed)
Take the cough medicine as prescribed.  See Dr. Delfina Redwood in the office if not feeling better in a week.

## 2017-05-06 NOTE — ED Provider Notes (Signed)
[facility] EMERGENCY DEPARTMENT Provider Note   CSN: [id number] Arrival date & time: 05/06/17  0740     History   Chief Complaint Chief Complaint  Patient presents with  . Cough    HPI Michelle Villarreal is a 71 y.o. female.  Complains of nonproductive cough sneeze, diffuse myalgias and nasal congestion onset 1 week ago.  No fever.  No other associated symptoms.  Treated with mucinex and Tylenol, without relief.  No nausea and vomiting no other associated symptoms denies shortness of breath  HPI  Past Medical History:  Diagnosis Date  . Adenoma    adrenal gland  . Bladder incontinence   . Cataracts, bilateral   . Chronic pain   . Diabetes ([place])   . Fall    09/30/13, 03/15/15, 06/14/15  . Fibromyalgia   . Hypertension   . Neuropathy   . Osteoarthritis   . Tinnitus     Patient Active Problem List   Diagnosis Date Noted  . Pulsatile tinnitus of right ear 07/27/2015  . Diabetes ([place]) 07/27/2015  . Essential hypertension 07/27/2015  . Hereditary and idiopathic peripheral neuropathy 07/27/2015  . Fibromyalgia 07/27/2015  Denies diabetes  Past Surgical History:  Procedure Laterality Date  . BREAST REDUCTION SURGERY    . BUNIONECTOMY    . REPLACEMENT TOTAL KNEE Bilateral 2003/2004  . TUBAL LIGATION    . VAGINAL HYSTERECTOMY      OB History    No data available       Home Medications    Prior to Admission medications   Medication Sig Start Date End Date Taking? Authorizing Provider  cholecalciferol (VITAMIN D) 1000 UNITS tablet Take 1,000 Units by mouth daily.    [provider]  losartan-hydrochlorothiazide (HYZAAR) 50-12.5 MG tablet Take 1 tablet by mouth daily. 11/16/15   [provider]  naproxen sodium (ANAPROX) 220 MG tablet Take 220 mg by mouth 2 (two) times daily with a meal. AS NEEDED    [provider]  vitamin E 400 UNIT capsule Take 400-800 Units by mouth. 3-4 times weekly per pt    [provider]     Family History Family History  Problem Relation Age of Onset  . Stroke Neg Hx   . Migraines Neg Hx   . Ataxia Neg Hx     Social History Social History   Tobacco Use  . Smoking status: Former Smoker  Substance Use Topics  . Alcohol use: Yes  . Drug use: No     Allergies   Ultram [tramadol]; Arixtra [fondaparinux]; Benzodiazepines; Codeine; Demerol [meperidine]; Gabapentin; Lyrica [pregabalin]; Morphine and related; and Zoloft [sertraline hcl]   Review of Systems Review of Systems  Constitutional: Negative.   HENT: Positive for congestion, rhinorrhea and sneezing.   Respiratory: Positive for cough.   Cardiovascular: Negative.   Gastrointestinal: Negative.   Musculoskeletal: Positive for myalgias.  Skin: Negative.   Neurological: Negative.   Psychiatric/Behavioral: Negative.   All other systems reviewed and are negative.    Physical Exam Updated Vital Signs BP (!) 142/81 (BP Location: Right Arm)   Pulse 78   Temp 98 F (36.7 C) (Oral)   Resp 16   Ht 5' 9.5" (1.765 m)   Wt 108.9 kg (240 lb)   SpO2 98%   BMI 34.93 kg/m   Physical Exam  Constitutional: She appears well-developed and well-nourished.  HENT:  Head: Normocephalic and atraumatic.  Eyes: Conjunctivae are normal. Pupils are equal, round, and reactive to light.  Neck: Neck supple. No tracheal deviation present. No thyromegaly present.  Cardiovascular: Normal rate and regular rhythm.  No murmur heard. Pulmonary/Chest: Effort normal and breath sounds normal.  Abdominal: Soft. Bowel sounds are normal. She exhibits no distension. There is no tenderness.  Musculoskeletal: Normal range of motion. She exhibits no edema or tenderness.  Neurological: She is alert. Coordination normal.  Skin: Skin is warm and dry. No rash noted.  Psychiatric: She has a normal mood and affect.  Nursing note and vitals reviewed.    ED Treatments / Results  Labs (all labs ordered are listed, but only abnormal results  are displayed) Labs Reviewed  BASIC METABOLIC PANEL - Abnormal; Notable for the following components:      Result Value   Glucose, Bld 104 (*)    All other components within normal limits  CBC WITH DIFFERENTIAL/PLATELET  I-STAT TROPONIN, ED    EKG  EKG Interpretation  Date/Time:  Monday May 06 2017 07:58:02 EST Ventricular Rate:  85 PR Interval:  138 QRS Duration: 86 QT Interval:  368 QTC Calculation: 437 R Axis:   70 Text Interpretation:  Normal sinus rhythm Normal ECG No significant change since last tracing Confirmed by Orlie Dakin [phone]) on 05/06/2017 2:24:29 PM      Results for orders placed or performed during the hospital encounter of 05/06/17  CBC with Differential  Result Value Ref Range   WBC 6.5 4.0 - 10.5 K/uL   RBC 4.46 3.87 - 5.11 MIL/uL   Hemoglobin 13.8 12.0 - 15.0 g/dL   HCT 42.5 36.0 - 46.0 %   MCV 95.3 78.0 - 100.0 fL   MCH 30.9 26.0 - 34.0 pg   MCHC 32.5 30.0 - 36.0 g/dL   RDW 13.9 11.5 - 15.5 %   Platelets 175 150 - 400 K/uL   Neutrophils Relative % 59 %   Neutro Abs 3.8 1.7 - 7.7 K/uL   Lymphocytes Relative 30 %   Lymphs Abs 2.0 0.7 - 4.0 K/uL   Monocytes Relative 6 %   Monocytes Absolute 0.4 0.1 - 1.0 K/uL   Eosinophils Relative 5 %   Eosinophils Absolute 0.3 0.0 - 0.7 K/uL   Basophils Relative 0 %   Basophils Absolute 0.0 0.0 - 0.1 K/uL  Basic metabolic panel  Result Value Ref Range   Sodium 141 135 - 145 mmol/L   Potassium 4.1 3.5 - 5.1 mmol/L   Chloride 105 101 - 111 mmol/L   CO2 29 22 - 32 mmol/L   Glucose, Bld 104 (H) 65 - 99 mg/dL   BUN 12 6 - 20 mg/dL   Creatinine, Ser 0.69 0.44 - 1.00 mg/dL   Calcium 9.7 8.9 - 10.3 mg/dL   GFR calc non Af Amer >60 >60 mL/min   GFR calc Af Amer >60 >60 mL/min   Anion gap 7 5 - 15  I-Stat Troponin, ED (not at [facility])  Result Value Ref Range   Troponin i, poc 0.00 0.00 - 0.08 ng/mL   Comment 3           Dg Chest 2 View  Result Date: 05/06/2017 CLINICAL DATA:  Cough for 1 week, worse  at night. EXAM: CHEST  2 VIEW COMPARISON:  01/25/2013 FINDINGS: Normal heart size and stable mild aortic tortuosity. There is no edema, consolidation, effusion, or pneumothorax. Artifact from EKG pads. Spondylosis. IMPRESSION: No evidence of active disease. Electronically Signed   By: Monte Fantasia M.D.   On: 05/06/2017 08:14   Radiology Dg Chest  2 View  Result Date: 05/06/2017 CLINICAL DATA:  Cough for 1 week, worse at night. EXAM: CHEST  2 VIEW COMPARISON:  01/25/2013 FINDINGS: Normal heart size and stable mild aortic tortuosity. There is no edema, consolidation, effusion, or pneumothorax. Artifact from EKG pads. Spondylosis. IMPRESSION: No evidence of active disease. Electronically Signed   By: Monte Fantasia M.D.   On: 05/06/2017 08:14    Procedures Procedures (including critical care time)  Medications Ordered in ED Medications - No data to display Chest x-ray viewed by me  Initial Impression / Assessment and Plan / ED Course  I have reviewed the triage vital signs and the nursing notes.  Pertinent labs & imaging results that were available during my care of the patient were reviewed by me and considered in my medical decision making (see chart for details).     Patient requests other cough medicine.  Tessalon written by me.  Suggest follow-up with Dr. Delfina Redwood if not better in a week  Final Clinical Impressions(s) / ED Diagnoses  Diagnosis viral upper respiratory infection Final diagnoses:  None    ED Discharge Orders    None       Orlie Dakin, MD 05/06/17 1459

## 2017-05-29 ENCOUNTER — Ambulatory Visit
Admission: RE | Admit: 2017-05-29 | Discharge: 2017-05-29 | Disposition: A | Discharge status: Home / Self Care | Payer: Medicare Other | Source: Ambulatory Visit | Attending: Internal Medicine | Admitting: Internal Medicine

## 2017-05-29 DIAGNOSIS — N644 Mastodynia: Secondary | ICD-10-CM

## 2017-05-29 DIAGNOSIS — N6452 Nipple discharge: Secondary | ICD-10-CM

## 2017-12-05 ENCOUNTER — Ambulatory Visit (HOSPITAL_COMMUNITY)
Admission: EM | Admit: 2017-12-05 | Discharge: 2017-12-05 | Disposition: A | Discharge status: Home / Self Care | Payer: Medicare Other | Attending: Family Medicine | Admitting: Family Medicine

## 2017-12-05 ENCOUNTER — Encounter (HOSPITAL_COMMUNITY): Payer: Self-pay | Admitting: Emergency Medicine

## 2017-12-05 DIAGNOSIS — R300 Dysuria: Secondary | ICD-10-CM | POA: Diagnosis not present

## 2017-12-05 LAB — POCT URINALYSIS DIP (DEVICE)
BILIRUBIN URINE: NEGATIVE
Glucose, UA: NEGATIVE mg/dL
Hgb urine dipstick: NEGATIVE
Ketones, ur: NEGATIVE mg/dL
Leukocytes, UA: NEGATIVE
NITRITE: NEGATIVE
PH: 7 (ref 5.0–8.0)
Protein, ur: NEGATIVE mg/dL
SPECIFIC GRAVITY, URINE: 1.01 (ref 1.005–1.030)
UROBILINOGEN UA: 0.2 mg/dL (ref 0.0–1.0)

## 2017-12-05 NOTE — ED Provider Notes (Signed)
[facility]    CSN: [id number] Arrival date & time: 12/05/17  1143     History   Chief Complaint Chief Complaint  Patient presents with  . Urinary Tract Infection    HPI Michelle Villarreal is a 72 y.o. female.   Patient is a 72 year old female with past medical history of adrenal gland adenoma, bladder incontinence, cystocele, chronic pain, diabetes, fibromyalgia, hypertension, neuropathy, osteoarthritis.  She reports that she has had dysuria for 10 days.  This is been a constant problem that is active every time she goes to the bathroom.  She does wear pads because she has a history of leakage.  She has been using multiple over-the-counter urinary pain relief medications that have helped slightly.  She reports that this morning she woke up for the first time in 10 days she did not have any discomfort with urination.  She drank a bottle of water and voided again with no pain.  She came in to be checked to just be sure that there is no infection.  She denies any hematuria, fever, back pain, chills, nausea.  She denies any vaginal discharge, bleeding, pelvic pain.  A few days ago she felt the cystocele and pushed it back in.    ROS per HPI        Past Medical History:  Diagnosis Date  . Adenoma    adrenal gland  . Bladder incontinence   . Cataracts, bilateral   . Chronic pain   . Diabetes ([place])   . Fall    09/30/13, 03/15/15, 06/14/15  . Fibromyalgia   . Hypertension   . Neuropathy   . Osteoarthritis   . Tinnitus     Patient Active Problem List   Diagnosis Date Noted  . Pulsatile tinnitus of right ear 07/27/2015  . Diabetes ([place]) 07/27/2015  . Essential hypertension 07/27/2015  . Hereditary and idiopathic peripheral neuropathy 07/27/2015  . Fibromyalgia 07/27/2015    Past Surgical History:  Procedure Laterality Date  . BREAST REDUCTION SURGERY    . BUNIONECTOMY    . REDUCTION MAMMAPLASTY Bilateral   . REPLACEMENT TOTAL KNEE Bilateral 2003/2004  . TUBAL  LIGATION    . VAGINAL HYSTERECTOMY      OB History   None      Home Medications    Prior to Admission medications   Medication Sig Start Date End Date Taking? Authorizing Provider  olmesartan-hydrochlorothiazide (BENICAR HCT) 20-12.5 MG tablet Take 1 tablet by mouth daily.   Yes [provider]  benzonatate (TESSALON) 100 MG capsule Take 1 capsule (100 mg total) by mouth every 8 (eight) hours. 05/06/17   Orlie Dakin, MD  cholecalciferol (VITAMIN D) 1000 UNITS tablet Take 1,000 Units by mouth daily.    [provider]  losartan-hydrochlorothiazide (HYZAAR) 50-12.5 MG tablet Take 1 tablet by mouth daily. 11/16/15   [provider]  naproxen sodium (ANAPROX) 220 MG tablet Take 220 mg by mouth 2 (two) times daily with a meal. AS NEEDED    [provider]  vitamin E 400 UNIT capsule Take 400-800 Units by mouth. 3-4 times weekly per pt    [provider]    Family History Family History  Problem Relation Age of Onset  . Stroke Neg Hx   . Migraines Neg Hx   . Ataxia Neg Hx     Social History Social History   Tobacco Use  . Smoking status: Former Smoker  Substance Use Topics  . Alcohol use: Yes  .  Drug use: No     Allergies   Ultram [tramadol]; Arixtra [fondaparinux]; Benzodiazepines; Codeine; Demerol [meperidine]; Gabapentin; Lyrica [pregabalin]; Morphine and related; and Zoloft [sertraline hcl]   Review of Systems Review of Systems   Physical Exam Triage Vital Signs ED Triage Vitals  Enc Vitals Group     BP 12/05/17 1203 (!) 148/88     Pulse Rate 12/05/17 1203 93     Resp 12/05/17 1203 16     Temp 12/05/17 1203 98.2 F (36.8 C)     Temp Source 12/05/17 1203 Oral     SpO2 12/05/17 1203 98 %     Weight --      Height --      Head Circumference --      Peak Flow --      Pain Score 12/05/17 1206 3     Pain Loc --      Pain Edu? --      Excl. in [place]? --    No data found.  Updated Vital Signs BP (!) 148/88    Pulse 93   Temp 98.2 F (36.8 C) (Oral)   Resp 16   SpO2 98%   Visual Acuity Right Eye Distance:   Left Eye Distance:   Bilateral Distance:    Right Eye Near:   Left Eye Near:    Bilateral Near:     Physical Exam  Constitutional: She is oriented to person, place, and time. She appears well-developed and well-nourished.  HENT:  Head: Normocephalic and atraumatic.  Neck: Normal range of motion.  Pulmonary/Chest: Effort normal.  Abdominal: Soft. She exhibits no distension and no mass. There is no tenderness. There is no rebound and no guarding. No hernia.  Neurological: She is alert and oriented to person, place, and time.  Skin: Skin is warm and dry. Capillary refill takes less than 2 seconds.  Psychiatric: She has a normal mood and affect.  Nursing note and vitals reviewed.    UC Treatments / Results  Labs (all labs ordered are listed, but only abnormal results are displayed) Labs Reviewed  POCT URINALYSIS DIP (DEVICE)    EKG None  Radiology No results found.  Procedures Procedures (including critical care time)  Medications Ordered in UC Medications - No data to display  Initial Impression / Assessment and Plan / UC Course  I have reviewed the triage vital signs and the nursing notes.  Pertinent labs & imaging results that were available during my care of the patient were reviewed by me and considered in my medical decision making (see chart for details).     Urine negative for infection.  Discussed the with the patient possibility of yeast infection causing the dysuria.  She was not convinced that it was yeast infection. Denied treatment for yeast.  Being that her symptoms have improved is a good sign.  Instructed if she develops any worsening symptoms or the pain returns please follow back up Final Clinical Impressions(s) / UC Diagnoses   Final diagnoses:  Dysuria     Discharge Instructions     It was nice meeting you!!  Your urine was negative for  infection.  We will send for culture.  Being that your symptoms have resolved this a.m. is a good sign.  If your symptoms come back or get worse please return for another evaluation or follow-up with PCP.   ED Prescriptions    None     Controlled Substance Prescriptions [place] Controlled Substance Registry consulted? Not Applicable  Orvan July, NP 12/05/17 1358

## 2017-12-05 NOTE — Discharge Instructions (Addendum)
It was nice meeting you!!  Your urine was negative for infection.  We will send for culture.  Being that your symptoms have resolved this a.m. is a good sign.  If your symptoms come back or get worse please return for another evaluation or follow-up with PCP.

## 2017-12-05 NOTE — ED Triage Notes (Signed)
PT reports urinary burning. PT has taken AZO, cystex without relief.   Symptoms for 10 days.

## 2017-12-24 DIAGNOSIS — N3946 Mixed incontinence: Secondary | ICD-10-CM | POA: Insufficient documentation

## 2018-02-17 ENCOUNTER — Encounter: Payer: Self-pay | Admitting: Neurology

## 2018-02-17 ENCOUNTER — Telehealth: Payer: Self-pay | Admitting: Neurology

## 2018-02-17 ENCOUNTER — Ambulatory Visit: Payer: Medicare Other | Admitting: Neurology

## 2018-02-17 VITALS — BP 132/86 | HR 78 | Ht 69.0 in | Wt 244.0 lb

## 2018-02-17 DIAGNOSIS — R4189 Other symptoms and signs involving cognitive functions and awareness: Secondary | ICD-10-CM

## 2018-02-17 NOTE — Progress Notes (Signed)
[facility]    Provider:  Dr Jaynee Eagles Referring Provider: Seward Carol, MD Primary Care Physician:  Seward Carol, MD   CC: Memory loss  Interval history 6279:  72 year old with a past medical history of hypertension, fall, chronic pain, osteoarthritis, neuropathy, diabetes, cataracts fibromyalgia. She is back again for memory loss, she was seen in 2017 and we agree on an FDG PET Scan and we had it approved and she decided she did not want to continue. She feels her memory continues to progressively worsen. She can't remember words and then they come to her in a few minutes. MMSE 29/30 2 years ago and 27/30. She has chronic low back pain and is on multiple medications. She also has no car and sits at home all day bored. She lives with her daughter and disabled grandson and it stresses her because her daughter won't take her grandson to be evaluated. No FHx of dementia but mother and father died young.  Interval history 12/31/15: Patient is here for a new problem, memory loss for 4-5 months. Patient is having changes in short-term memory. She Is forgetting things. When she had nerve blocks with me she couldn't remember signing the consent and came back for a copy. Can't rememebr how to spell simple words. Using the wrong words in texts and leaving words off. Has left the stove on 3x since being in here. She forgot her pin. She stopped her blood pressure medications and the symptoms of her right ear improved. No Fhx of alzheimers however parents died young. Symptoms started around November of last year with noise in her ear. Progressively worsening. She is very tired during the day. She has multiple medical problems per patient such as hip and bacl pain and knee paon. Shs is under a lot of stress. She has significant pain in her hips. She is anxious, she is worried that she has early-onset dementia and no one will take care of her. She hs neuropathy which hurts her in bed, wrse at  night, not sleeping well, hip and back pain. Progressive memory changes. Confusion.   MRI of the brain 07/2015:  Slightly abnormal MRI scan of the brain showing mild changes of chronic microvascular ischemia. No structural lesions are noted.  Interval history 08/09/2015: She is still having pain in the right side of the posterior head. She has pounding, throbbing, severe. It is radiating from the right occipital area, tender, and into the temple. The pain has been continuous. Worse when she wakes up in the morning. Discussed the pain could be occipital neuralgia. Showed patient images of the occipital nerve. We will order an MRI of the brain. The CTA of the head was unremarkable. Reviewed CTA findings. Will perform trigger point injections today.   HPI: LINDYN VOSSLER is a 72 y.o. female here as a referral from Dr. Delfina Redwood and Dr. Ernesto Rutherford for right ear booming tinnitus. Past medical history of hypertension, fall, chronic pain, osteoarthritis, neuropathy, diabetes, cataracts fibromyalgia. Murmur started in the right ear. It went away then started booming, pounding, Sounds like blood flowing through a vessel. She had a fall Nov 1st and symptoms started 3 weeks afterwards but she does not think this had anything to do with it, she did not hit her head. Sometimes it is soft other times is unbelieveble. Only in the right ear. Sometimes it is roaring, sometimes like a banjo and her ear vibrates along with it and the pressure is severe. Started 2 months ago  but in the last week have increased in severity. The pain behind the ear is new about a week. It is so loud it wakes her up in the middle of the night. Just in the right ear. Worse at night. Comes and goes during the day. Has tried alleve, tylenol doesn't help. Doesn't drink caffeine. She has tried lots of things, nothing helps. She is on a medrol dosepak now. She was given Decadron. No PMHx of headaches or migraines. May get a headache with blood pressure or  with dehydration but rare. But until a week ago it was just noise,vibration and no pain. A week ago started feeling pressure in the ear and pain behind the ear. The pain behind the ear is tenderness, painful, not tingly, not electric. Tenderness behind the left ear and in the temples. Has also had stabbing pain through the ear then it went away. She also had a fall the last day of January at the same spot, didn't hit her head. She cannot tolerate the MRI.   Reviewed notes, labs and imaging from outside physicians, which showed:  Labs were drawn 06/08/2015: CMP normal creatinine of 0.76. TSH 1.75.  Patient was evaluated by Dr. Ernesto Rutherford. She came with a complaint of right ear booming tinnitus related to her pulse. ENT evaluation reveals both ears to be completely clear after he remove cerumen from the right ear, tympanic membranes move well, no evidence of fluid behind the tympanic membrane. Nose is completely clear. Larynx is clear. True cords, false cord, epiglottis, base of tongue, lateral pharyngeal walls are clear, and she cord mobility, Reflex, tongue mobility, extraocular movements, facial nerve are all symmetrical. Neck is free of thyromegaly, cervical adenopathy or masses. Trachea midline. No evidence of bruit. No cardiac arrhythmias. No history of cardiac problem. Oriented 3. Diagnosis is possible vertigo, she has a history of orthostatic hypotension. Plan is for hearing test attempted MRI of the brain patient did not tolerate. On audiogram, she has a 15 dB speech reception threshold on both sides and in 96 and 100% discrimination score.  Review of Systems: Patient complains of symptoms per HPI as well as the following symptoms: insomnia, numbness, sleepiness, joint pain, cramps, allergies. Pertinent negatives per HPI. All others negative.   Social History   Socioeconomic History  . Marital status: Single    Spouse name: Not on file  . Number of children: Not on file  . Years of  education: Not on file  . Highest education level: Not on file  Occupational History  . Not on file  Social Needs  . Financial resource strain: Not on file  . Food insecurity:    Worry: Not on file    Inability: Not on file  . Transportation needs:    Medical: Not on file    Non-medical: Not on file  Tobacco Use  . Smoking status: Former Smoker  Substance and Sexual Activity  . Alcohol use: Yes  . Drug use: No  . Sexual activity: Never  Lifestyle  . Physical activity:    Days per week: Not on file    Minutes per session: Not on file  . Stress: Not on file  Relationships  . Social connections:    Talks on phone: Not on file    Gets together: Not on file    Attends religious service: Not on file    Active member of club or organization: Not on file    Attends meetings of clubs or organizations: Not on file  Relationship status: Not on file  . Intimate partner violence:    Fear of current or ex partner: Not on file    Emotionally abused: Not on file    Physically abused: Not on file    Forced sexual activity: Not on file  Other Topics Concern  . Not on file  Social History Narrative   Lives alone   Caffeine use: minimal     Family History  Problem Relation Age of Onset  . Stroke Neg Hx   . Migraines Neg Hx   . Ataxia Neg Hx     Past Medical History:  Diagnosis Date  . Adenoma    adrenal gland  . Bladder incontinence   . Cataracts, bilateral   . Chronic pain   . Diabetes ([place])   . Fall    09/30/13, 03/15/15, 06/14/15  . Fibromyalgia   . Hypertension   . Neuropathy   . Osteoarthritis   . Tinnitus     Past Surgical History:  Procedure Laterality Date  . BREAST REDUCTION SURGERY    . BUNIONECTOMY    . REDUCTION MAMMAPLASTY Bilateral   . REPLACEMENT TOTAL KNEE Bilateral 2003/2004  . TUBAL LIGATION    . VAGINAL HYSTERECTOMY      Current Outpatient Medications  Medication Sig Dispense Refill  . benzonatate (TESSALON) 100 MG capsule Take 1 capsule  (100 mg total) by mouth every 8 (eight) hours. 21 capsule 0  . Cholecalciferol (VITAMIN D3 PO) Take 800 Units by mouth 3 (three) times a week.    . Liniments (SALONPAS PAIN RELIEF PATCH EX) Apply topically daily as needed.    . Methen-Hyosc-Meth Blue-Na Phos (UROGESIC-BLUE PO) Take 1 tablet by mouth 4 (four) times daily as needed.    . naproxen sodium (ANAPROX) 220 MG tablet Take 220 mg by mouth every 8 (eight) hours as needed. AS NEEDED    . olmesartan-hydrochlorothiazide (BENICAR HCT) 20-12.5 MG tablet Take 1 tablet by mouth daily.    Marland Kitchen oxybutynin (DITROPAN-XL) 10 MG 24 hr tablet Take 10 mg by mouth daily.    . vitamin E 400 UNIT capsule Take 400-800 Units by mouth. 3-4 times weekly per pt     No current facility-administered medications for this visit.     Allergies as of 02/17/2018 - Review Complete 02/17/2018  Allergen Reaction Noted  . Ultram [tramadol] Shortness Of Breath 01/25/2013  . Arixtra [fondaparinux] Other (See Comments) 01/25/2013  . Benzodiazepines Other (See Comments) 01/25/2013  . Codeine Itching 01/25/2013  . Demerol [meperidine] Nausea Only 01/25/2013  . Gabapentin Other (See Comments) 01/25/2013  . Lyrica [pregabalin] Other (See Comments) 01/25/2013  . Morphine and related Hives 01/25/2013  . Zoloft [sertraline hcl] Other (See Comments) 01/25/2013    Vitals: BP 132/86   Pulse 78   Ht 5\' 9"  (1.753 m)   Wt 244 lb (110.7 kg)   BMI 36.03 kg/m  Last Weight:  Wt Readings from Last 1 Encounters:  02/17/18 244 lb (110.7 kg)   Last Height:   Ht Readings from Last 1 Encounters:  02/17/18 5\' 9"  (1.753 m)    Physical exam: Exam: Gen: NAD, conversant, well nourised, obese, well groomed  CV: RRR, no MRG. No Carotid Bruits. No peripheral edema, warm, nontender Eyes: Conjunctivae clear without exudates or hemorrhage  Neuro: Detailed Neurologic Exam  Speech:  Speech is normal; fluent and spontaneous with normal comprehension.   Cognition:  The patient is oriented to person, place, and time;   recent and remote memory intact;  language fluent;   normal attention, concentration,   fund of knowledge  MMSE - Mini Mental State Exam 02/17/2018 12/28/2015  Not completed: (No Data) -  Orientation to time 5 5  Orientation to Place 5 5  Registration 3 3  Attention/ Calculation 2 5  Recall 3 2  Language- name 2 objects 2 2  Language- repeat 1 1  Language- follow 3 step command 3 3  Language- read & follow direction 1 1  Write a sentence 1 1  Copy design 1 1  Total score 27 29   Cranial Nerves:  The pupils are equal, round, and reactive to light. The fundi are normal and spontaneous venous pulsations are present. Visual fields are full to finger confrontation. Extraocular movements are intact. Trigeminal sensation is intact and the muscles of mastication are normal. The face is symmetric. The palate elevates in the midline. Hearing intact. Voice is normal. Shoulder shrug is normal. The tongue has normal motion without fasciculations.   Coordination:  Normal finger to nose and heel to shin. Normal rapid alternating movements.   Gait:  Heel-toe and tandem gait are normal.   Motor Observation:  No asymmetry, no atrophy, and no involuntary movements noted. Tone:  Normal muscle tone.   Posture:  Posture is normal. normal erect   Strength:  Strength is V/V in the upper and lower limbs.    Sensation: intact to LT   Reflex Exam:  DTR's: Patellars surgical otherwise deep tendon reflexes in the upper and lower extremities are normal bilaterally.  Toes:  The toes are downgoing bilaterally.  Clonus:  Clonus is absent.      Assessment/Plan: 72 year old female here for years of progressive memory change in the setting of chronic pain and stress. She is back again c/o memory problems.   - Memory loss: Likely multifactorial due to normal aging,  chronic pain, stress. MMSE 29/30 2 years ago and 27/30   - Formal neurocognitive testing  - She was last seen in 2017 and we agree on an FDG PET Scan and we had it approved and she decided she did not want to continue.   B12, TSH in the past 2017 were normal will not repeat, she was having the same symptoms at that time  CC: Dr. Delfina Redwood  Orders Placed This Encounter  Procedures  . Ambulatory referral to Neuropsychology    Sarina Ill, MD  [facility] [address] [place], [place] [address]  Phone [phone] Fax [phone]  A total of 25 minutes was spent face-to-face with this patient. Over half this time was spent on counseling patient on the  1. Subjective memory complaints    and different diagnostic and therapeutic options available.

## 2018-02-17 NOTE — Telephone Encounter (Signed)
Pt states she checked with [facility] and [facility] is not covered with them, she is asking for a call back to know if Dr Jaynee Eagles will refer her to someone who is covered under  [facility] please call

## 2018-02-17 NOTE — Patient Instructions (Addendum)
Formal memory testing will call for appointment   Neuropsychological Examination A neuropsychological examination is a series of tests related to thinking and behavior. It is done to see how the brain works. The exam may be done on people who have had a brain injury or trauma or who have a disease or disorder that affects the brain. The results will help your health care provider diagnose any problems and put together a helpful treatment or assistance plan. Tell a health care provider about:  Any allergies you have.  Your sports and activities.  Recent injuries or surgery.  All medicines you are taking, including vitamins, herbs, eye drops, creams, and over-the-counter medicines.  Any medical conditions you have.  Any other issues or behaviors that might affect your ability to take the exam. What happens before the procedure?  Ask your health care provider about how medicines you are taking may affect the exam results.  Prepare a list of current medicines to bring to the exam.  Ask a family member or friend to come to the test if you have trouble remembering family history or any other details.  Prepare any records of previous neurological testing, including CT or MRI scans, to bring to the exam.  Get a good night's rest before the exam.  Do not drink alcohol 24 hours before the exam.  Let the examiner know about any other issues or behaviors that might affect your ability to take the exam. What happens during the procedure? The test is usually performed at an outpatient clinic. You will sit at a table for most of the exam. It can also be done bedside in a hospital setting, if necessary. During the exam, you will answer questions, make choices, read, write, and complete simple physical tasks. You might also use a computer-guided lesson. There is no pain, needles, or electrodes. A trained examiner will be there. There are several areas, or modules, to the exam. The examiner will  administer the ones that most closely relate to the reason for your referral and the disease or symptoms. The examination may take several hours depending on the areas being tested. What happens after the procedure?  You may go home after the exam.  Follow up with your health care provider for the results. This information is not intended to replace advice given to you by your health care provider. Make sure you discuss any questions you have with your health care provider. Document Released: 11/25/2013 Document Revised: 11/18/2015 Document Reviewed: 06/19/2013 Elsevier Interactive Patient Education  2018 [place] [employer] Strategies  1. Use "WARM" strategy.  W= write it down  A= associate it  R= repeat it  M= make a mental note  2.   You can keep a Social worker.  Use a 3-ring notebook with sections for the following: calendar, important names and phone numbers,  medications, doctors' names/phone numbers, lists/reminders, and a section to journal what you did  each day.   3.    Use a calendar to write appointments down.  4.    Write yourself a schedule for the day.  This can be placed on the calendar or in a separate section of the Memory Notebook.  Keeping a  regular schedule can help memory.  5.    Use medication organizer with sections for each day or morning/evening pills.  You may need help loading it  6.    Keep a basket, or pegboard by the door.  Place items that you  need to take out with you in the basket or on the pegboard.  You may also want to  include a message board for reminders.  7.    Use sticky notes.  Place sticky notes with reminders in a place where the task is performed.  For example: " turn off the  stove" placed by the stove, "lock the door" placed on the door at eye level, " take your medications" on  the bathroom mirror or by the place where you normally take your medications.  8.    Use alarms/timers.  Use while cooking to remind  yourself to check on food or as a reminder to take your medicine, or as a  reminder to make a call, or as a reminder to perform another task, etc.

## 2018-02-17 NOTE — Telephone Encounter (Signed)
[name] will take care of Ms. Michelle Villarreal thanks

## 2018-02-18 NOTE — Telephone Encounter (Signed)
I have called Michelle Villarreal and left them a message asking them to call me back. I called patient and left her a message letting her Know I am working on this.

## 2018-02-18 NOTE — Telephone Encounter (Signed)
I dont have anything further for her memory, if dr Si Raider cant accmodate we can try Dr. Chauncy Passy. [name] please advise.

## 2018-02-18 NOTE — Telephone Encounter (Signed)
[name] accepts her insurance patient is aware .

## 2018-02-18 NOTE — Telephone Encounter (Signed)
Patient states she cannot see Dr. Si Raider until April of next year. She also says with her artificial knees she cannot sit for 2 hours due to osteoarthritis for the test that they want to do. Please call and discuss.

## 2018-02-18 NOTE — Telephone Encounter (Signed)
[name] is covered and patient is going to and schedule.

## 2018-02-18 NOTE — Telephone Encounter (Signed)
Spoke with Ms. Bagsby and explained to her that Dr. Marcia Brash office might be able to accommodate her due her issues with her knees, she had an appointment scheduled with Dr. Si Raider 08/28/2018 at 9 am that was cancelled because she can't sit for a long period of time. She stated that if she feels that's her memory has deteriorated any further that she would come back in to see Dr. Jaynee Eagles.

## 2018-07-08 ENCOUNTER — Ambulatory Visit (HOSPITAL_COMMUNITY)
Admission: EM | Admit: 2018-07-08 | Discharge: 2018-07-08 | Disposition: A | Discharge status: Home / Self Care | Payer: Medicare Other | Attending: Family Medicine | Admitting: Family Medicine

## 2018-07-08 ENCOUNTER — Ambulatory Visit (INDEPENDENT_AMBULATORY_CARE_PROVIDER_SITE_OTHER): Payer: Medicare Other

## 2018-07-08 ENCOUNTER — Encounter (HOSPITAL_COMMUNITY): Payer: Self-pay | Admitting: Emergency Medicine

## 2018-07-08 DIAGNOSIS — J069 Acute upper respiratory infection, unspecified: Secondary | ICD-10-CM

## 2018-07-08 DIAGNOSIS — R05 Cough: Secondary | ICD-10-CM

## 2018-07-08 DIAGNOSIS — B9789 Other viral agents as the cause of diseases classified elsewhere: Secondary | ICD-10-CM

## 2018-07-08 MED ORDER — BENZONATATE 100 MG PO CAPS: 100.0000 mg | ORAL_CAPSULE | Freq: Three times a day (TID) | ORAL | 0 refills | Status: DC

## 2018-07-08 NOTE — ED Provider Notes (Signed)
[name]    CSN: [id number] Arrival date & time: 07/08/18  1004     History   Chief Complaint Chief Complaint  Patient presents with  . Cough    HPI Michelle Villarreal is a 73 y.o. female.   Is a 73 year old female presents today with approximately 8 days of cough, congestion.  Her symptoms have been constant and worsening.  Her symptoms are worse at night.  She has been using Mucinex DM every 12 hours without any relief of symptoms. She is coughing up thick green, blood tinged mucous.  Denies any associated fevers, chills, body aches.  She is having some rib pain due to coughing.  No recent sick contacts.  No recent travel anywhere.  ROS per HPI      Past Medical History:  Diagnosis Date  . Adenoma    adrenal gland  . Bladder incontinence   . Cataracts, bilateral   . Chronic pain   . Diabetes ([place])   . Fall    09/30/13, 03/15/15, 06/14/15  . Fibromyalgia   . Hypertension   . Neuropathy   . Osteoarthritis   . Tinnitus     Patient Active Problem List   Diagnosis Date Noted  . Subjective memory complaints 02/17/2018  . Pulsatile tinnitus of right ear 07/27/2015  . Diabetes ([place]) 07/27/2015  . Essential hypertension 07/27/2015  . Hereditary and idiopathic peripheral neuropathy 07/27/2015  . Fibromyalgia 07/27/2015    Past Surgical History:  Procedure Laterality Date  . BREAST REDUCTION SURGERY    . BUNIONECTOMY    . REDUCTION MAMMAPLASTY Bilateral   . REPLACEMENT TOTAL KNEE Bilateral 2003/2004  . TUBAL LIGATION    . VAGINAL HYSTERECTOMY      OB History   No obstetric history on file.      Home Medications    Prior to Admission medications   Medication Sig Start Date End Date Taking? Authorizing Provider  guaiFENesin (MUCINEX) 600 MG 12 hr tablet Take by mouth 2 (two) times daily.   Yes [provider]  benzonatate (TESSALON) 100 MG capsule Take 1 capsule (100 mg total) by mouth every 8 (eight) hours. 07/08/18   Loura Halt A, NP    Cholecalciferol (VITAMIN D3 PO) Take 800 Units by mouth 3 (three) times a week.    [provider]  Liniments (SALONPAS PAIN RELIEF PATCH EX) Apply topically daily as needed.    [provider]  Methen-Hyosc-Meth Blue-Na Phos (UROGESIC-BLUE PO) Take 1 tablet by mouth 4 (four) times daily as needed.    [provider]  naproxen sodium (ANAPROX) 220 MG tablet Take 220 mg by mouth every 8 (eight) hours as needed. AS NEEDED    [provider]  olmesartan-hydrochlorothiazide (BENICAR HCT) 20-12.5 MG tablet Take 1 tablet by mouth daily.    [provider]  oxybutynin (DITROPAN-XL) 10 MG 24 hr tablet Take 10 mg by mouth daily.    [provider]  vitamin E 400 UNIT capsule Take 400-800 Units by mouth. 3-4 times weekly per pt    [provider]    Family History Family History  Problem Relation Age of Onset  . Stroke Neg Hx   . Migraines Neg Hx   . Ataxia Neg Hx     Social History Social History   Tobacco Use  . Smoking status: Former Smoker  Substance Use Topics  . Alcohol use: Yes  . Drug use: No     Allergies   Ultram [tramadol]; Arixtra [  fondaparinux]; Benzodiazepines; Codeine; Demerol [meperidine]; Gabapentin; Lyrica [pregabalin]; Morphine and related; and Zoloft [sertraline hcl]   Review of Systems Review of Systems   Physical Exam Triage Vital Signs ED Triage Vitals [07/08/18 1019]  Enc Vitals Group     BP (!) 147/75     Pulse Rate 92     Resp 18     Temp 98 F (36.7 C)     Temp src      SpO2 99 %     Weight      Height      Head Circumference      Peak Flow      Pain Score      Pain Loc      Pain Edu?      Excl. in [employer]?    No data found.  Updated Vital Signs BP (!) 147/75   Pulse 92   Temp 98 F (36.7 C)   Resp 18   SpO2 99%   Visual Acuity Right Eye Distance:   Left Eye Distance:   Bilateral Distance:    Right Eye Near:   Left Eye Near:    Bilateral Near:     Physical  Exam Constitutional:      Appearance: She is ill-appearing. She is not toxic-appearing.  HENT:     Head: Normocephalic and atraumatic.     Right Ear: Tympanic membrane and ear canal normal.     Left Ear: Tympanic membrane and ear canal normal.     Nose: Congestion present.     Mouth/Throat:     Pharynx: Oropharynx is clear.  Eyes:     Conjunctiva/sclera: Conjunctivae normal.  Neck:     Musculoskeletal: Normal range of motion.  Cardiovascular:     Rate and Rhythm: Normal rate and regular rhythm.     Pulses: Normal pulses.     Heart sounds: Normal heart sounds.  Pulmonary:     Effort: Pulmonary effort is normal.     Breath sounds: Normal breath sounds.  Skin:    General: Skin is warm and dry.  Neurological:     Mental Status: She is alert.  Psychiatric:        Mood and Affect: Mood normal.      UC Treatments / Results  Labs (all labs ordered are listed, but only abnormal results are displayed) Labs Reviewed - No data to display  EKG None  Radiology Dg Chest 2 View  Result Date: 07/08/2018 CLINICAL DATA:  Cough. EXAM: CHEST - 2 VIEW COMPARISON:  Radiographs of May 06, 2017. FINDINGS: The heart size and mediastinal contours are within normal limits. Both lungs are clear. The visualized skeletal structures are unremarkable. IMPRESSION: No active cardiopulmonary disease. Electronically Signed   By: Marijo Conception, M.D.   On: 07/08/2018 11:28    Procedures Procedures (including critical care time)  Medications Ordered in UC Medications - No data to display  Initial Impression / Assessment and Plan / UC Course  I have reviewed the triage vital signs and the nursing notes.  Pertinent labs & imaging results that were available during my care of the patient were reviewed by me and considered in my medical decision making (see chart for details).     X ray did not reveal any pneumonia Most likely viral Will give pt tessalon pearls for cough Have her continue the  mucinex.  Follow up as needed for continued or worsening symptoms  Final Clinical Impressions(s) / UC Diagnoses   Final diagnoses:  Viral URI with cough     Discharge Instructions     X-ray was negative for pneumonia This is likely a viral infection.  We will do Tessalon Perles every 8 hours for cough.  You can continue the Mucinex every 12 hours Follow up as needed for continued or worsening symptoms     ED Prescriptions    Medication Sig Dispense Auth. Provider   benzonatate (TESSALON) 100 MG capsule Take 1 capsule (100 mg total) by mouth every 8 (eight) hours. 21 capsule Loura Halt A, NP     Controlled Substance Prescriptions [place] Controlled Substance Registry consulted? Not Applicable   Orvan July, NP 07/08/18 1149

## 2018-07-08 NOTE — ED Triage Notes (Signed)
Pt c/o cough x8 days, states its worse at night. Denies fever.

## 2018-07-08 NOTE — Discharge Instructions (Addendum)
X-ray was negative for pneumonia This is likely a viral infection.  We will do Tessalon Perles every 8 hours for cough.  You can continue the Mucinex every 12 hours Follow up as needed for continued or worsening symptoms

## 2018-07-23 ENCOUNTER — Encounter (HOSPITAL_COMMUNITY): Payer: Self-pay | Admitting: Emergency Medicine

## 2018-07-23 ENCOUNTER — Ambulatory Visit (HOSPITAL_COMMUNITY)
Admission: EM | Admit: 2018-07-23 | Discharge: 2018-07-23 | Disposition: A | Discharge status: Home / Self Care | Payer: Medicare Other | Attending: Family Medicine | Admitting: Family Medicine

## 2018-07-23 DIAGNOSIS — J40 Bronchitis, not specified as acute or chronic: Secondary | ICD-10-CM | POA: Diagnosis not present

## 2018-07-23 MED ORDER — PREDNISONE 50 MG PO TABS: 50.0000 mg | ORAL_TABLET | Freq: Every day | ORAL | 0 refills | Status: AC

## 2018-07-23 MED ORDER — AZITHROMYCIN 250 MG PO TABS
250.0000 mg | ORAL_TABLET | Freq: Every day | ORAL | 0 refills | Status: DC
Start: 2018-07-23 — End: 2019-05-19

## 2018-07-23 MED ORDER — BENZONATATE 200 MG PO CAPS: 200.0000 mg | ORAL_CAPSULE | Freq: Three times a day (TID) | ORAL | 0 refills | Status: AC | PRN

## 2018-07-23 MED ORDER — PROMETHAZINE-DM 6.25-15 MG/5ML PO SYRP: 5.0000 mL | ORAL_SOLUTION | Freq: Four times a day (QID) | ORAL | 0 refills | Status: DC | PRN

## 2018-07-23 MED ORDER — ALBUTEROL SULFATE HFA 108 (90 BASE) MCG/ACT IN AERS: 1.0000 | INHALATION_SPRAY | Freq: Four times a day (QID) | RESPIRATORY_TRACT | 0 refills | Status: DC | PRN

## 2018-07-23 NOTE — ED Triage Notes (Signed)
Pt states she was seen ehre 2/25 for cough given tessalon and ran out, c/o ongoing cough since then.

## 2018-07-23 NOTE — Discharge Instructions (Signed)
Please begin azithromycin-2 tablets today, 1 tablet for the following 4 days Begin prednisone daily for the next 5 days, to please take with food and in the morning if you are able Please use albuterol inhaler as needed for shortness of breath, chest tightness or wheezing Tessalon as needed for cough during the day every 8 hours May use promethazine DM cough syrup at home or bedtime, do not mix with Tessalon, may cause drowsiness, do not drive, operate machinery after  Please continue to monitor your cough, breathing and temperature, follow-up if symptoms still persisting, worsening, developing increased shortness of breath or difficulty breathing, fevers

## 2018-07-23 NOTE — ED Provider Notes (Signed)
[place]    CSN: [id number] Arrival date & time: 07/23/18  1121     History   Chief Complaint Chief Complaint  Patient presents with  . Cough    HPI Michelle Villarreal Gest is a 73 y.o. female history of hypertension, DM, fibromyalgia, presenting today for evaluation of a cough.  Patient states that she has had a cough for the past 3 to 4 weeks.  She was recently seen here on 2/25 and treated for viral URI with cough and provided Tessalon.  The [name] is been helping intermittently.  She has now run out of the Tessalon and symptoms are persisting.  Denies any fevers.  The cough is been productive.  Denies chest pain.  She is also used over-the-counter Mucinex, Delsym without relief.  Denies nausea vomiting or diarrhea.  Cough worse at nighttime.  Denies history of asthma or smoking.  HPI  Past Medical History:  Diagnosis Date  . Adenoma    adrenal gland  . Bladder incontinence   . Cataracts, bilateral   . Chronic pain   . Diabetes ([place])   . Fall    09/30/13, 03/15/15, 06/14/15  . Fibromyalgia   . Hypertension   . Neuropathy   . Osteoarthritis   . Tinnitus     Patient Active Problem List   Diagnosis Date Noted  . Subjective memory complaints 02/17/2018  . Pulsatile tinnitus of right ear 07/27/2015  . Diabetes ([place]) 07/27/2015  . Essential hypertension 07/27/2015  . Hereditary and idiopathic peripheral neuropathy 07/27/2015  . Fibromyalgia 07/27/2015    Past Surgical History:  Procedure Laterality Date  . BREAST REDUCTION SURGERY    . BUNIONECTOMY    . REDUCTION MAMMAPLASTY Bilateral   . REPLACEMENT TOTAL KNEE Bilateral 2003/2004  . TUBAL LIGATION    . VAGINAL HYSTERECTOMY      OB History   No obstetric history on file.      Home Medications    Prior to Admission medications   Medication Sig Start Date End Date Taking? Authorizing Provider  albuterol (PROVENTIL HFA;VENTOLIN HFA) 108 (90 Base) MCG/ACT inhaler Inhale 1-2 puffs into the lungs every  6 (six) hours as needed for wheezing or shortness of breath. 07/23/18   [name] C, PA-C  azithromycin (ZITHROMAX) 250 MG tablet Take 1 tablet (250 mg total) by mouth daily. Take first 2 tablets together, then 1 every day until finished. 07/23/18   [name] C, PA-C  benzonatate (TESSALON) 200 MG capsule Take 1 capsule (200 mg total) by mouth 3 (three) times daily as needed for up to 7 days for cough. 07/23/18 07/30/18  [name] C, PA-C  Cholecalciferol (VITAMIN D3 PO) Take 800 Units by mouth 3 (three) times a week.    [provider]  guaiFENesin (MUCINEX) 600 MG 12 hr tablet Take by mouth 2 (two) times daily.    [provider]  Liniments (SALONPAS PAIN RELIEF PATCH EX) Apply topically daily as needed.    [provider]  Methen-Hyosc-Meth Blue-Na Phos (UROGESIC-BLUE PO) Take 1 tablet by mouth 4 (four) times daily as needed.    [provider]  naproxen sodium (ANAPROX) 220 MG tablet Take 220 mg by mouth every 8 (eight) hours as needed. AS NEEDED    [provider]  olmesartan-hydrochlorothiazide (BENICAR HCT) 20-12.5 MG tablet Take 1 tablet by mouth daily.    [provider]  oxybutynin (DITROPAN-XL) 10 MG 24 hr tablet Take 10 mg by mouth daily.    [provider]  predniSONE (DELTASONE) 50 MG tablet Take 1 tablet (50 mg total) by mouth daily with breakfast for 5 days. 07/23/18 07/28/18  [name] C, PA-C  promethazine-dextromethorphan (PROMETHAZINE-DM) 6.25-15 MG/5ML syrup Take 5 mLs by mouth 4 (four) times daily as needed for cough. Will cause drowsiness, do not drive or work after 3/66/44   [name], [employer] C, PA-C  vitamin E 400 UNIT capsule Take 400-800 Units by mouth. 3-4 times weekly per pt    [provider]    Family History Family History  Problem Relation Age of Onset  . Stroke Neg Hx   . Migraines Neg Hx   . Ataxia Neg Hx     Social History Social History   Tobacco Use  . Smoking  status: Former Smoker  Substance Use Topics  . Alcohol use: Yes  . Drug use: No     Allergies   Ultram [tramadol]; Arixtra [fondaparinux]; Benzodiazepines; Codeine; Demerol [meperidine]; Gabapentin; Lyrica [pregabalin]; Morphine and related; and Zoloft [sertraline hcl]   Review of Systems Review of Systems  Constitutional: Negative for activity change, appetite change, chills, fatigue and fever.  HENT: Positive for congestion and rhinorrhea. Negative for ear pain, sinus pressure, sore throat and trouble swallowing.   Eyes: Negative for discharge and redness.  Respiratory: Positive for cough. Negative for chest tightness and shortness of breath.   Cardiovascular: Negative for chest pain.  Gastrointestinal: Negative for abdominal pain, diarrhea, nausea and vomiting.  Musculoskeletal: Negative for myalgias.  Skin: Negative for rash.  Neurological: Negative for dizziness, light-headedness and headaches.     Physical Exam Triage Vital Signs ED Triage Vitals  Enc Vitals Group     BP 07/23/18 1132 140/83     Pulse Rate 07/23/18 1132 90     Resp 07/23/18 1132 16     Temp 07/23/18 1132 97.7 F (36.5 C)     Temp src --      SpO2 07/23/18 1132 99 %     Weight --      Height --      Head Circumference --      Peak Flow --      Pain Score 07/23/18 1133 0     Pain Loc --      Pain Edu? --      Excl. in [place]? --    No data found.  Updated Vital Signs BP 140/83   Pulse 90   Temp 97.7 F (36.5 C)   Resp 16   SpO2 99%   Visual Acuity Right Eye Distance:   Left Eye Distance:   Bilateral Distance:    Right Eye Near:   Left Eye Near:    Bilateral Near:     Physical Exam Vitals signs and nursing note reviewed.  Constitutional:      General: She is not in acute distress.    Appearance: She is well-developed.  HENT:     Head: Normocephalic and atraumatic.     Ears:     Comments: Bilateral ears without tenderness to palpation of external auricle, tragus and mastoid, EAC's  without erythema or swelling, TM's with good bony landmarks and cone of light. Non erythematous.     Mouth/Throat:     Comments: Oral mucosa pink and moist, no tonsillar enlargement or exudate. Posterior pharynx patent and nonerythematous, no uvula deviation or swelling. Normal phonation. Eyes:     Conjunctiva/sclera: Conjunctivae normal.  Neck:     Musculoskeletal: Neck supple.  Cardiovascular:  Rate and Rhythm: Normal rate and regular rhythm.     Heart sounds: No murmur.  Pulmonary:     Effort: Pulmonary effort is normal. No respiratory distress.     Breath sounds: Wheezing present.     Comments: Breathing comfortably at rest, occasional cough in room, cough with deep inspiration triggering wheezing, lungs clear otherwise Abdominal:     Palpations: Abdomen is soft.     Tenderness: There is no abdominal tenderness.  Skin:    General: Skin is warm and dry.  Neurological:     Mental Status: She is alert.      UC Treatments / Results  Labs (all labs ordered are listed, but only abnormal results are displayed) Labs Reviewed - No data to display  EKG None  Radiology No results found.  Procedures Procedures (including critical care time)  Medications Ordered in UC Medications - No data to display  Initial Impression / Assessment and Plan / UC Course  I have reviewed the triage vital signs and the nursing notes.  Pertinent labs & imaging results that were available during my care of the patient were reviewed by me and considered in my medical decision making (see chart for details).     Previous x-ray negative on previous visit, patient declined any further imaging.  Will treat for bronchitis given wheezing and persistent symptoms.  Azithromycin, prednisone, albuterol, Tessalon during the day.  Did provide Promethazine DM for cough when at home or bedtime.  Just discussed drowsiness regarding this.  Continue to monitor cough, temperature and breathing,Discussed strict  return precautions. Patient verbalized understanding and is agreeable with plan.  Final Clinical Impressions(s) / UC Diagnoses   Final diagnoses:  Bronchitis     Discharge Instructions     Please begin azithromycin-2 tablets today, 1 tablet for the following 4 days Begin prednisone daily for the next 5 days, to please take with food and in the morning if you are able Please use albuterol inhaler as needed for shortness of breath, chest tightness or wheezing Tessalon as needed for cough during the day every 8 hours May use promethazine DM cough syrup at home or bedtime, do not mix with Tessalon, may cause drowsiness, do not drive, operate machinery after  Please continue to monitor your cough, breathing and temperature, follow-up if symptoms still persisting, worsening, developing increased shortness of breath or difficulty breathing, fevers   ED Prescriptions    Medication Sig Dispense Auth. Provider   azithromycin (ZITHROMAX) 250 MG tablet Take 1 tablet (250 mg total) by mouth daily. Take first 2 tablets together, then 1 every day until finished. 6 tablet Jamael Hoffmann C, PA-C   predniSONE (DELTASONE) 50 MG tablet Take 1 tablet (50 mg total) by mouth daily with breakfast for 5 days. 5 tablet Lindee Leason C, PA-C   albuterol (PROVENTIL HFA;VENTOLIN HFA) 108 (90 Base) MCG/ACT inhaler Inhale 1-2 puffs into the lungs every 6 (six) hours as needed for wheezing or shortness of breath. 1 Inhaler Raylee Adamec C, PA-C   benzonatate (TESSALON) 200 MG capsule Take 1 capsule (200 mg total) by mouth 3 (three) times daily as needed for up to 7 days for cough. 28 capsule Raphaella Larkin C, PA-C   promethazine-dextromethorphan (PROMETHAZINE-DM) 6.25-15 MG/5ML syrup Take 5 mLs by mouth 4 (four) times daily as needed for cough. Will cause drowsiness, do not drive or work after 277 mL Alazne Quant, [place] C, PA-C     Controlled Substance Prescriptions [place] Controlled Substance Registry consulted? Not  Applicable  Matix Henshaw, George C, PA-C 07/23/18 1223

## 2018-08-28 ENCOUNTER — Encounter: Payer: Medicare Other | Admitting: Psychology

## 2019-01-06 ENCOUNTER — Other Ambulatory Visit: Payer: Self-pay

## 2019-01-06 DIAGNOSIS — Z20822 Contact with and (suspected) exposure to covid-19: Secondary | ICD-10-CM

## 2019-01-07 LAB — NOVEL CORONAVIRUS, NAA: SARS-CoV-2, NAA: NOT DETECTED

## 2019-01-08 ENCOUNTER — Telehealth: Payer: Self-pay | Admitting: *Deleted

## 2019-01-08 NOTE — Telephone Encounter (Signed)
Pt notified of results

## 2019-01-15 ENCOUNTER — Telehealth: Payer: Self-pay | Admitting: General Practice

## 2019-01-15 NOTE — Telephone Encounter (Signed)
Negative COVID results given. Patient results "NOT Detected." Caller expressed understanding. ° °

## 2019-04-20 ENCOUNTER — Other Ambulatory Visit: Payer: Self-pay | Admitting: Internal Medicine

## 2019-04-20 DIAGNOSIS — M8580 Other specified disorders of bone density and structure, unspecified site: Secondary | ICD-10-CM

## 2019-05-18 NOTE — Progress Notes (Signed)
Cardiology Office Note:   Date:  05/19/2019  NAME:  Michelle Villarreal    MRN: [id number] DOB:  31-Jan-1946   PCP:  Seward Carol, MD  Cardiologist:  No primary care provider on file.   Referring MD: Seward Carol, MD   Chief Complaint  Patient presents with  . Palpitations   History of Present Illness:   Michelle Villarreal is a 74 y.o. female with a hx of hypertension, fibromyalgia who is being seen today for the evaluation of palpitations at the request of Seward Carol, MD.  She presents for the evaluation of 5 months of intermittent chest pain and palpitations.  She also reports intermittent shortness of breath.  She reports 3-4 times per week she can get episodes of shortness of breath, palpitations and chest pressure.  They last for 3 to 4 hours.  She reports she can get either of the symptoms in any combination and no identifiable trigger or alleviating factor.  She reports they simply resolved after laying around.  She reports no worsening chest pressure or shortness of breath with exertion.  She reports it can happen with exertion or can happen at rest.  She has no history of diabetes.  There is questionable depression as there are many family issues in her home right now.  Cardiovascular disease risk factors include hypertension as well as remote smoking history.  No strong family history of cardiovascular disease.  Review of laboratory data shows normal cholesterol and normal kidney function.  She had recent thyroid studies that were normal as well.  Laboratory from primary care physician reviewed.  Recent CT scan also reviewed below.  Laboratory data from primary care office dated 04/20/2019, total cholesterol 134, HDL 63, LDL 61, triglycerides 40, hemoglobin 12.9, creatinine 0.65, TSH 1.86  Presents with data from an outpatient CT abdomen pelvis dated 02/2018 which demonstrated calcifications in the coronary arteries as well as atherosclerosis in the peripheral vasculature, aortoiliac  distribution.  Past Medical History: Past Medical History:  Diagnosis Date  . Adenoma    adrenal gland  . Bladder incontinence   . Cataracts, bilateral   . Chronic pain   . Diabetes ([place])   . Fall    09/30/13, 03/15/15, 06/14/15  . Fibromyalgia   . Hypertension   . Neuropathy   . Osteoarthritis   . Tinnitus     Past Surgical History: Past Surgical History:  Procedure Laterality Date  . BREAST REDUCTION SURGERY    . BUNIONECTOMY    . REDUCTION MAMMAPLASTY Bilateral   . REPLACEMENT TOTAL KNEE Bilateral 2003/2004  . TUBAL LIGATION    . VAGINAL HYSTERECTOMY      Current Medications: Current Meds  Medication Sig  . Cholecalciferol (VITAMIN D3 PO) Take 800 Units by mouth 3 (three) times a week.  Marland Kitchen guaiFENesin (MUCINEX) 600 MG 12 hr tablet Take by mouth 2 (two) times daily.  . Liniments (SALONPAS PAIN RELIEF PATCH EX) Apply topically daily as needed.  . Methen-Hyosc-Meth Blue-Na Phos (UROGESIC-BLUE PO) Take 1 tablet by mouth 4 (four) times daily as needed.  . naproxen sodium (ANAPROX) 220 MG tablet Take 220 mg by mouth every 8 (eight) hours as needed. AS NEEDED  . olmesartan-hydrochlorothiazide (BENICAR HCT) 20-12.5 MG tablet Take 1 tablet by mouth daily.  . rosuvastatin (CRESTOR) 5 MG tablet Take 5 mg by mouth daily.  . vitamin E 400 UNIT capsule Take 400-800 Units by mouth. 3-4 times weekly per pt     Allergies:    Ultram [tramadol],  Arixtra [fondaparinux], Benzodiazepines, Codeine, Demerol [meperidine], Gabapentin, Lyrica [pregabalin], Morphine and related, and Zoloft [sertraline hcl]   Social History: Social History   Socioeconomic History  . Marital status: Single    Spouse name: Not on file  . Number of children: 1  . Years of education: Not on file  . Highest education level: Not on file  Occupational History  . Not on file  Tobacco Use  . Smoking status: Former Smoker    Years: 5.00  . Smokeless tobacco: Never Used  Substance and Sexual Activity  .  Alcohol use: Yes  . Drug use: No  . Sexual activity: Never  Other Topics Concern  . Not on file  Social History Narrative   Lives alone   Caffeine use: minimal    Social Determinants of Health   Financial Resource Strain:   . Difficulty of Paying Living Expenses: Not on file  Food Insecurity:   . Worried About Charity fundraiser in the Last Year: Not on file  . Ran Out of Food in the Last Year: Not on file  Transportation Needs:   . Lack of Transportation (Medical): Not on file  . Lack of Transportation (Non-Medical): Not on file  Physical Activity:   . Days of Exercise per Week: Not on file  . Minutes of Exercise per Session: Not on file  Stress:   . Feeling of Stress : Not on file  Social Connections:   . Frequency of Communication with Friends and Family: Not on file  . Frequency of Social Gatherings with Friends and Family: Not on file  . Attends Religious Services: Not on file  . Active Member of Clubs or Organizations: Not on file  . Attends Archivist Meetings: Not on file  . Marital Status: Not on file     Family History: The patient's family history includes Arthritis in her brother; Diabetes in her sister; Ovarian cancer in her mother. There is no history of Stroke, Migraines, or Ataxia.  ROS:   All other ROS reviewed and negative. Pertinent positives noted in the HPI.     EKGs/Labs/Other Studies Reviewed:   The following studies were personally reviewed by me today:  EKG:  EKG is ordered today.  The ekg ordered today demonstrates normal sinus rhythm, heart rate 94, right bundle branch block, no acute ischemic changes, no evidence of prior infarction, and was personally reviewed by me.   Recent Labs: No results found for requested labs within last 8760 hours.   Recent Lipid Panel No results found for: CHOL, TRIG, HDL, CHOLHDL, VLDL, LDLCALC, LDLDIRECT  Physical Exam:   VS:  BP 134/88   Pulse 94   Ht 5\' 10"  (1.778 m)   Wt 245 lb 9.6 oz (111.4  kg)   SpO2 99%   BMI 35.24 kg/m    Wt Readings from Last 3 Encounters:  05/19/19 245 lb 9.6 oz (111.4 kg)  02/17/18 244 lb (110.7 kg)  05/06/17 240 lb (108.9 kg)    General: Well nourished, well developed, in no acute distress Heart: Atraumatic, normal size  Eyes: PEERLA, EOMI  Neck: Supple, no JVD Endocrine: No thryomegaly Cardiac: Normal S1, S2; RRR; no murmurs, rubs, or gallops Lungs: Clear to auscultation bilaterally, no wheezing, rhonchi or rales  Abd: Soft, nontender, no hepatomegaly  Ext: No edema, pulses 2+ Musculoskeletal: No deformities, BUE and BLE strength normal and equal Skin: Warm and dry, no rashes   Neuro: Alert and oriented to person, place, time, and  situation, CNII-XII grossly intact, no focal deficits  Psych: Normal mood and affect   ASSESSMENT:   Michelle Villarreal is a 75 y.o. female who presents for the following: 1. Chest pain of uncertain etiology   2. Palpitations   3. SOB (shortness of breath)   4. Coronary artery disease involving native heart without angina pectoris, unspecified vessel or lesion type   5. Essential hypertension    PLAN:   1. Chest pain of uncertain etiology 2. Palpitations 3. SOB (shortness of breath) -She presents for evaluation of intermittent chest pressure, shortness of breath and palpitations.  Symptoms are rather atypical for cardiac disease.  We will start with an echocardiogram and nuclear medicine stress test.  I have also ordered a 7-day Zio patch to exclude any significant arrhythmias.  Her EKG just simply shows right bundle branch block and no acute ischemic changes no evidence of prior infarction.  She does have a CT abdomen pelvis that demonstrated calcifications in the left circumflex territory as well as aortic atherosclerotic disease.  This also is more prudent to evaluate her for potential CAD.  Nonetheless she will continue on aspirin and her cholesterol Crestor 5 mg daily.  Review of recent LDL is within normal  limits  4. Coronary artery disease involving native heart without angina pectoris, unspecified vessel or lesion type -Coronary calcium seen on CT abdomen pelvis from last year -LDL cholesterol 61 -Continue aspirin 81 mg/day and Crestor 5 mg daily  5. Essential hypertension -Well-controlled today on current medications   Disposition: Return in about 3 months (around 08/17/2019).  Medication Adjustments/Labs and Tests Ordered: Current medicines are reviewed at length with the patient today.  Concerns regarding medicines are outlined above.  Orders Placed This Encounter  Procedures  . Myocardial Perfusion Imaging  . LONG TERM MONITOR (3-14 DAYS)  . EKG 12-Lead  . ECHOCARDIOGRAM COMPLETE   No orders of the defined types were placed in this encounter.   Patient Instructions  Medication Instructions:  Your physician recommends that you continue on your current medications as directed. Please refer to the Current Medication list given to you today.  *If you need a refill on your cardiac medications before your next appointment, please call your pharmacy*  Lab Work: NONE If you have labs (blood work) drawn today and your tests are completely normal, you will receive your results only by: Marland Kitchen MyChart Message (if you have MyChart) OR . A paper copy in the mail If you have any lab test that is abnormal or we need to change your treatment, we will call you to review the results.  Testing/Procedures: Your physician has requested that you have a lexiscan myoview. For further information please visit [email or web address]. Please follow instruction sheet, as given. LOCATION: [facility] Address: [address], [place], [place] [address]  Your physician has requested that you have an echocardiogram. Echocardiography is a painless test that uses sound waves to create images of your heart. It provides your doctor with information about the size and shape of  your heart and how well your heart's chambers and valves are working. This procedure takes approximately one hour. There are no restrictions for this procedure. LOCATION: [facility]: [place], [place], [place] [address]   Your physician has recommended that you wear a 7 DAY ZIO-PATCH monitor. The Zio patch cardiac monitor continuously records heart rhythm data for up to 14 days, this is for  patients being evaluated for multiple types heart rhythms. For the first 24 hours post application, please avoid getting the Zio monitor wet in the shower or by excessive sweating during exercise. After that, feel free to carry on with regular activities. Keep soaps and lotions away from the ZIO XT Patch.  This will be MAILED TO YOU.     ZIO XT- Long Term Monitor Instructions   Your physician has requested you wear your ZIO patch monitor 7 days.   This is a single patch monitor.  [employer] supplies one patch monitor per enrollment.  Additional stickers are not available.   Please do not apply patch if you will be having a Nuclear Stress Test, Echocardiogram, Cardiac CT, MRI, or Chest Xray during the time frame you would be wearing the monitor. The patch cannot be worn during these tests.  You cannot remove and re-apply the ZIO XT patch monitor.   Your ZIO patch monitor will be sent USPS Priority mail from [facility] directly to your home address. The monitor may also be mailed to a PO BOX if home delivery is not available.   It may take 3-5 days to receive your monitor after you have been enrolled.   Once you have received you monitor, please review enclosed instructions.  Your monitor has already been registered assigning a specific monitor serial # to you.   Applying the monitor   Shave hair from upper left chest.   Hold abrader disc by orange tab.  Rub abrader in 40 strokes over left upper chest as indicated in your monitor instructions.    Clean area with 4 enclosed alcohol pads .  Use all pads to assure are is cleaned thoroughly.  Let dry.   Apply patch as indicated in monitor instructions.  Patch will be place under collarbone on left side of chest with arrow pointing upward.   Rub patch adhesive wings for 2 minutes.Remove white label marked "1".  Remove white label marked "2".  Rub patch adhesive wings for 2 additional minutes.   While looking in a mirror, press and release button in center of patch.  A small green light will flash 3-4 times .  This will be your only indicator the monitor has been turned on.     Do not shower for the first 24 hours.  You may shower after the first 24 hours.   Press button if you feel a symptom. You will hear a small click.  Record Date, Time and Symptom in the Patient Log Book.   When you are ready to remove patch, follow instructions on last 2 pages of Patient Log Book.  Stick patch monitor onto last page of Patient Log Book.   Place Patient Log Book in [place] box.  Use locking tab on box and tape box closed securely.  The [employer] has [employer] on it.  Please place in mailbox as soon as possible.  Your physician should have your test results approximately 7 days after the monitor has been mailed back to [facility].   Call [facility] at [phone] if you have questions regarding your ZIO XT patch monitor.  Call them immediately if you see an orange light blinking on your monitor.   If your monitor falls off in less than 4 days contact our Monitor department at [phone].  If your monitor becomes loose or falls off after 4 days call [employer] at [phone] for suggestions on securing your monitor.  Follow-Up: At [facility], you and your health needs are our priority.  As part of our continuing mission to provide you with exceptional heart care, we have created designated Provider Care Teams.  These Care Teams include your primary  Cardiologist (physician) and Advanced Practice Providers (APPs -  Physician Assistants and Nurse Practitioners) who all work together to provide you with the care you need, when you need it.  Your next appointment:   3 month(s)  The format for your next appointment:   In Person  Provider:   Eleonore Chiquito, MD     Cardiac Nuclear Scan A cardiac nuclear scan is a test that is done to check the flow of blood to your heart. It is done when you are resting and when you are exercising. The test looks for problems such as:  Not enough blood reaching a portion of the heart.  The heart muscle not working as it should. You may need this test if:  You have heart disease.  You have had lab results that are not normal.  You have had heart surgery or a balloon procedure to open up blocked arteries (angioplasty).  You have chest pain.  You have shortness of breath. In this test, a special dye (tracer) is put into your bloodstream. The tracer will travel to your heart. A camera will then take pictures of your heart to see how the tracer moves through your heart. This test is usually done at a hospital and takes 2-4 hours. Tell a doctor about:  Any allergies you have.  All medicines you are taking, including vitamins, herbs, eye drops, creams, and over-the-counter medicines.  Any problems you or family members have had with anesthetic medicines.  Any blood disorders you have.  Any surgeries you have had.  Any medical conditions you have.  Whether you are pregnant or may be pregnant. What are the risks? Generally, this is a safe test. However, problems may occur, such as:  Serious chest pain and heart attack. This is only a risk if the stress portion of the test is done.  Rapid heartbeat.  A feeling of warmth in your chest. This feeling usually does not last long.  Allergic reaction to the tracer. What happens before the test?  Ask your doctor about changing or stopping your  normal medicines. This is important.  Follow instructions from your doctor about what you cannot eat or drink.  Remove your jewelry on the day of the test. What happens during the test?  An IV tube will be inserted into one of your veins.  Your doctor will give you a small amount of tracer through the IV tube.  You will wait for 20-40 minutes while the tracer moves through your bloodstream.  Your heart will be monitored with an electrocardiogram (ECG).  You will lie down on an exam table.  Pictures of your heart will be taken for about 15-20 minutes.  You may also have a stress test. For this test, one of these things may be done: ? You will be asked to exercise on a treadmill or a stationary bike. ? You will be given medicines that will make your heart work harder. This is done if you are unable to exercise.  When blood flow to your heart has peaked, a tracer will again be given through the IV tube.  After 20-40 minutes, you will get back on the exam table. More pictures will be taken of your heart.  Depending on the tracer  that is used, more pictures may need to be taken 3-4 hours later.  Your IV tube will be removed when the test is over. The test may vary among doctors and hospitals. What happens after the test?  Ask your doctor: ? Whether you can return to your normal schedule, including diet, activities, and medicines. ? Whether you should drink more fluids. This will help to remove the tracer from your body. Drink enough fluid to keep your pee (urine) pale yellow.  Ask your doctor, or the department that is doing the test: ? When will my results be ready? ? How will I get my results? Summary  A cardiac nuclear scan is a test that is done to check the flow of blood to your heart.  Tell your doctor whether you are pregnant or may be pregnant.  Before the test, ask your doctor about changing or stopping your normal medicines. This is important.  Ask your doctor  whether you can return to your normal activities. You may be asked to drink more fluids. This information is not intended to replace advice given to you by your health care provider. Make sure you discuss any questions you have with your health care provider. Document Revised: 08/20/2018 Document Reviewed: 10/14/2017 Elsevier Patient Education  2020 [place], [place]ie [address], [place]  [address] [place], [place] [address] [phone]  05/19/2019 9:38 AM

## 2019-05-19 ENCOUNTER — Encounter: Payer: Self-pay | Admitting: Cardiovascular Disease

## 2019-05-19 ENCOUNTER — Ambulatory Visit: Payer: Medicare Other | Admitting: Cardiovascular Disease

## 2019-05-19 ENCOUNTER — Telehealth: Payer: Self-pay

## 2019-05-19 ENCOUNTER — Other Ambulatory Visit: Payer: Self-pay

## 2019-05-19 VITALS — BP 134/88 | HR 94 | Ht 70.0 in | Wt 245.6 lb

## 2019-05-19 DIAGNOSIS — R079 Chest pain, unspecified: Secondary | ICD-10-CM | POA: Diagnosis not present

## 2019-05-19 DIAGNOSIS — R002 Palpitations: Secondary | ICD-10-CM | POA: Diagnosis not present

## 2019-05-19 DIAGNOSIS — R0602 Shortness of breath: Secondary | ICD-10-CM

## 2019-05-19 DIAGNOSIS — I2510 Atherosclerotic heart disease of native coronary artery without angina pectoris: Secondary | ICD-10-CM

## 2019-05-19 DIAGNOSIS — I10 Essential (primary) hypertension: Secondary | ICD-10-CM

## 2019-05-19 NOTE — Telephone Encounter (Signed)
Contacted pt to confirm address, phone number, and insurance for Zio patch. Informed pt that requested info sent to staff in monitor dept.  Pt states when she was setting up stress test appt was supposed to be 2/14 and 2/15 but the calendar she was provided listed 1/14 and 1/15 and another set of paperwork listed 2/14 and 2/15. Informed pt that Epic has 1/14 and 1/15 for stress test appts. Pt would like this changed to 2/14 and 2/15. Informed pt that encounter to be routed to scheduling to update. Pt agreeable

## 2019-05-19 NOTE — Patient Instructions (Signed)
Medication Instructions:  Your physician recommends that you continue on your current medications as directed. Please refer to the Current Medication list given to you today.  *If you need a refill on your cardiac medications before your next appointment, please call your pharmacy*  Lab Work: NONE If you have labs (blood work) drawn today and your tests are completely normal, you will receive your results only by: Marland Kitchen MyChart Message (if you have MyChart) OR . A paper copy in the mail If you have any lab test that is abnormal or we need to change your treatment, we will call you to review the results.  Testing/Procedures: Your physician has requested that you have a lexiscan myoview. For further information please visit [email or web address]. Please follow instruction sheet, as given. LOCATION: [facility] Address: [address], [place], [place] [address]  Your physician has requested that you have an echocardiogram. Echocardiography is a painless test that uses sound waves to create images of your heart. It provides your doctor with information about the size and shape of your heart and how well your heart's chambers and valves are working. This procedure takes approximately one hour. There are no restrictions for this procedure. LOCATION: [facility]: [place], [place], [place] [address]   Your physician has recommended that you wear a 7 DAY ZIO-PATCH monitor. The Zio patch cardiac monitor continuously records heart rhythm data for up to 14 days, this is for patients being evaluated for multiple types heart rhythms. For the first 24 hours post application, please avoid getting the Zio monitor wet in the shower or by excessive sweating during exercise. After that, feel free to carry on with regular activities. Keep soaps and lotions away from the ZIO XT Patch.  This will be MAILED TO YOU.     ZIO XT-  Long Term Monitor Instructions   Your physician has requested you wear your ZIO patch monitor 7 days.   This is a single patch monitor.  [employer] supplies one patch monitor per enrollment.  Additional stickers are not available.   Please do not apply patch if you will be having a Nuclear Stress Test, Echocardiogram, Cardiac CT, MRI, or Chest Xray during the time frame you would be wearing the monitor. The patch cannot be worn during these tests.  You cannot remove and re-apply the ZIO XT patch monitor.   Your ZIO patch monitor will be sent USPS Priority mail from [facility] directly to your home address. The monitor may also be mailed to a PO BOX if home delivery is not available.   It may take 3-5 days to receive your monitor after you have been enrolled.   Once you have received you monitor, please review enclosed instructions.  Your monitor has already been registered assigning a specific monitor serial # to you.   Applying the monitor   Shave hair from upper left chest.   Hold abrader disc by orange tab.  Rub abrader in 40 strokes over left upper chest as indicated in your monitor instructions.   Clean area with 4 enclosed alcohol pads .  Use all pads to assure are is cleaned thoroughly.  Let dry.   Apply patch as indicated in monitor instructions.  Patch will be place under collarbone on left side of chest with arrow pointing upward.   Rub patch adhesive wings for 2 minutes.Remove white label marked "1".  Remove white label marked "2".  Rub patch adhesive wings for 2 additional minutes.   While looking in a mirror, press and release button in center of patch.  A small green light will flash 3-4 times .  This will be your only indicator the monitor has been turned on.     Do not shower for the first 24 hours.  You may shower after the first 24 hours.   Press button if you feel a symptom. You will hear a small click.  Record Date, Time and Symptom in the Patient Log Book.     When you are ready to remove patch, follow instructions on last 2 pages of Patient Log Book.  Stick patch monitor onto last page of Patient Log Book.   Place Patient Log Book in [place] box.  Use locking tab on box and tape box closed securely.  The [employer] has [employer] on it.  Please place in mailbox as soon as possible.  Your physician should have your test results approximately 7 days after the monitor has been mailed back to [facility].   Call [place] at [phone] if you have questions regarding your ZIO XT patch monitor.  Call them immediately if you see an orange light blinking on your monitor.   If your monitor falls off in less than 4 days contact our Monitor department at [phone].  If your monitor becomes loose or falls off after 4 days call [employer] at [phone] for suggestions on securing your monitor.    Follow-Up: At [facility], you and your health needs are our priority.  As part of our continuing mission to provide you with exceptional heart care, we have created designated Provider Care Teams.  These Care Teams include your primary Cardiologist (physician) and Advanced Practice Providers (APPs -  Physician Assistants and Nurse Practitioners) who all work together to provide you with the care you need, when you need it.  Your next appointment:   3 month(s)  The format for your next appointment:   In Person  Provider:   Eleonore Chiquito, MD     Cardiac Nuclear Scan A cardiac nuclear scan is a test that is done to check the flow of blood to your heart. It is done when you are resting and when you are exercising. The test looks for problems such as:  Not enough blood reaching a portion of the heart.  The heart muscle not working as it should. You may need this test if:  You have heart disease.  You have had lab results that are not normal.  You have had heart surgery or a balloon procedure to open up blocked  arteries (angioplasty).  You have chest pain.  You have shortness of breath. In this test, a special dye (tracer) is put into your bloodstream. The tracer will travel to your heart. A camera will then take pictures of your heart to see how the tracer moves through your heart. This test is usually done at a hospital and takes 2-4 hours. Tell a doctor about:  Any allergies you have.  All medicines you are taking, including vitamins, herbs, eye drops, creams, and over-the-counter medicines.  Any problems you or family members have had with anesthetic medicines.  Any blood disorders you have.  Any surgeries you have had.  Any medical conditions you have.  Whether you are pregnant or may be pregnant. What are the risks? Generally, this is a safe test. However, problems may occur, such as:  Serious  chest pain and heart attack. This is only a risk if the stress portion of the test is done.  Rapid heartbeat.  A feeling of warmth in your chest. This feeling usually does not last long.  Allergic reaction to the tracer. What happens before the test?  Ask your doctor about changing or stopping your normal medicines. This is important.  Follow instructions from your doctor about what you cannot eat or drink.  Remove your jewelry on the day of the test. What happens during the test?  An IV tube will be inserted into one of your veins.  Your doctor will give you a small amount of tracer through the IV tube.  You will wait for 20-40 minutes while the tracer moves through your bloodstream.  Your heart will be monitored with an electrocardiogram (ECG).  You will lie down on an exam table.  Pictures of your heart will be taken for about 15-20 minutes.  You may also have a stress test. For this test, one of these things may be done: ? You will be asked to exercise on a treadmill or a stationary bike. ? You will be given medicines that will make your heart work harder. This is done  if you are unable to exercise.  When blood flow to your heart has peaked, a tracer will again be given through the IV tube.  After 20-40 minutes, you will get back on the exam table. More pictures will be taken of your heart.  Depending on the tracer that is used, more pictures may need to be taken 3-4 hours later.  Your IV tube will be removed when the test is over. The test may vary among doctors and hospitals. What happens after the test?  Ask your doctor: ? Whether you can return to your normal schedule, including diet, activities, and medicines. ? Whether you should drink more fluids. This will help to remove the tracer from your body. Drink enough fluid to keep your pee (urine) pale yellow.  Ask your doctor, or the department that is doing the test: ? When will my results be ready? ? How will I get my results? Summary  A cardiac nuclear scan is a test that is done to check the flow of blood to your heart.  Tell your doctor whether you are pregnant or may be pregnant.  Before the test, ask your doctor about changing or stopping your normal medicines. This is important.  Ask your doctor whether you can return to your normal activities. You may be asked to drink more fluids. This information is not intended to replace advice given to you by your health care provider. Make sure you discuss any questions you have with your health care provider. Document Revised: 08/20/2018 Document Reviewed: 10/14/2017 Elsevier Patient Education  [place].

## 2019-05-20 ENCOUNTER — Telehealth: Payer: Self-pay | Admitting: Cardiovascular Disease

## 2019-05-20 ENCOUNTER — Encounter: Payer: Self-pay | Admitting: *Deleted

## 2019-05-20 NOTE — Telephone Encounter (Signed)
New Message:    Pt needs you to call her. She is scheduled for quite a few tests. She said she will not be able to do some of them, she can not afford them.

## 2019-05-20 NOTE — Telephone Encounter (Signed)
LMTCB

## 2019-05-20 NOTE — Progress Notes (Signed)
Patient ID: Michelle Villarreal, female   DOB: [birth year], 74 y.o.   MRN: [id number] Patient enrolled for 7 day ZIO XT long term holter monitor to be mailed to her home.

## 2019-05-22 NOTE — Telephone Encounter (Signed)
Spoke with pt re the importance of getting myoview done and was told would have to pay $100-$110 dollar co pay for both days and does not have the funds at this time Informed pt could set up payment plan but pt has other testing that needs to be done as well Per pt will try and reschedule myoview when has financial means to pay .

## 2019-05-28 ENCOUNTER — Inpatient Hospital Stay (HOSPITAL_COMMUNITY): Admission: RE | Admit: 2019-05-28 | Payer: Medicare Other | Source: Ambulatory Visit

## 2019-05-29 ENCOUNTER — Inpatient Hospital Stay (HOSPITAL_COMMUNITY): Admission: RE | Admit: 2019-05-29 | Payer: Medicare Other | Source: Ambulatory Visit

## 2019-05-30 ENCOUNTER — Encounter (HOSPITAL_COMMUNITY): Payer: Self-pay

## 2019-05-30 ENCOUNTER — Other Ambulatory Visit: Payer: Self-pay

## 2019-05-30 ENCOUNTER — Ambulatory Visit (INDEPENDENT_AMBULATORY_CARE_PROVIDER_SITE_OTHER): Payer: Medicare Other

## 2019-05-30 ENCOUNTER — Encounter (HOSPITAL_BASED_OUTPATIENT_CLINIC_OR_DEPARTMENT_OTHER): Payer: Self-pay | Admitting: Emergency Medicine

## 2019-05-30 ENCOUNTER — Ambulatory Visit (HOSPITAL_COMMUNITY)
Admission: EM | Admit: 2019-05-30 | Discharge: 2019-05-30 | Disposition: A | Discharge status: Home / Self Care | Payer: Medicare Other | Source: Home / Self Care

## 2019-05-30 ENCOUNTER — Emergency Department (HOSPITAL_BASED_OUTPATIENT_CLINIC_OR_DEPARTMENT_OTHER)
Admission: EM | Admit: 2019-05-30 | Discharge: 2019-05-30 | Disposition: A | Discharge status: Home / Self Care | Payer: Medicare Other | Attending: Emergency Medicine | Admitting: Emergency Medicine

## 2019-05-30 DIAGNOSIS — R000 Tachycardia, unspecified: Secondary | ICD-10-CM

## 2019-05-30 DIAGNOSIS — Z5321 Procedure and treatment not carried out due to patient leaving prior to being seen by health care provider: Secondary | ICD-10-CM | POA: Diagnosis not present

## 2019-05-30 DIAGNOSIS — N644 Mastodynia: Secondary | ICD-10-CM

## 2019-05-30 DIAGNOSIS — R0602 Shortness of breath: Secondary | ICD-10-CM

## 2019-05-30 DIAGNOSIS — R0789 Other chest pain: Secondary | ICD-10-CM

## 2019-05-30 NOTE — ED Triage Notes (Signed)
Pt present pain on the left side of her breast and under her left arm. Pt states that the pain started a few days ago.

## 2019-05-30 NOTE — ED Provider Notes (Addendum)
[facility]    CSN: [id number] Arrival date & time: 05/30/19  1112      History   Chief Complaint Chief Complaint  Patient presents with  . Breast Problem    HPI Michelle Villarreal is a 74 y.o. female.   HPI  Patient with a history of vascular disease and CAD presents today with a complaint of left breast, left chest wall and left-sided rib pain gradually worsening x 4 days. She had an colonoscopy performed on Tuesday and developed sharp, left upper chest wall pain immediately after awakening from the procedure. The pain has gradually worsened. Patient initially thought is was localized to the left breast as she has fibrotic breast disorder. The pain is not reproducible. Pain is worst with inspiration and radiates throughout the left breast into the left lower ribs. No known history of pulmonary embolism. Reports a personal history of vascular disease prior DVT, although no current chronic anticoagulant therapy. Past Medical History:  Diagnosis Date  . Adenoma    adrenal gland  . Bladder incontinence   . Cataracts, bilateral   . Chronic pain   . Diabetes ([place])   . Fall    09/30/13, 03/15/15, 06/14/15  . Fibromyalgia   . Hypertension   . Neuropathy   . Osteoarthritis   . Tinnitus     Patient Active Problem List   Diagnosis Date Noted  . Subjective memory complaints 02/17/2018  . Pulsatile tinnitus of right ear 07/27/2015  . Diabetes ([place]) 07/27/2015  . Essential hypertension 07/27/2015  . Hereditary and idiopathic peripheral neuropathy 07/27/2015  . Fibromyalgia 07/27/2015    Past Surgical History:  Procedure Laterality Date  . BREAST REDUCTION SURGERY    . BUNIONECTOMY    . REDUCTION MAMMAPLASTY Bilateral   . REPLACEMENT TOTAL KNEE Bilateral 2003/2004  . TUBAL LIGATION    . VAGINAL HYSTERECTOMY      OB History   No obstetric history on file.      Home Medications    Prior to Admission medications   Medication Sig Start Date End Date Taking?  Authorizing Provider  Cholecalciferol (VITAMIN D3 PO) Take 800 Units by mouth 3 (three) times a week.    [provider]  guaiFENesin (MUCINEX) 600 MG 12 hr tablet Take by mouth 2 (two) times daily.    [provider]  Liniments (SALONPAS PAIN RELIEF PATCH EX) Apply topically daily as needed.    [provider]  Methen-Hyosc-Meth Blue-Na Phos (UROGESIC-BLUE PO) Take 1 tablet by mouth 4 (four) times daily as needed.    [provider]  naproxen sodium (ANAPROX) 220 MG tablet Take 220 mg by mouth every 8 (eight) hours as needed. AS NEEDED    [provider]  olmesartan-hydrochlorothiazide (BENICAR HCT) 20-12.5 MG tablet Take 1 tablet by mouth daily.    [provider]  rosuvastatin (CRESTOR) 5 MG tablet Take 5 mg by mouth daily. 02/03/19   [provider]  vitamin E 400 UNIT capsule Take 400-800 Units by mouth. 3-4 times weekly per pt    [provider]    Family History Family History  Problem Relation Age of Onset  . Ovarian cancer Mother   . Diabetes Sister   . Arthritis Brother   . Stroke Neg Hx   . Migraines Neg Hx   . Ataxia Neg Hx     Social History Social History   Tobacco Use  . Smoking status: Former Smoker    Years: 5.00  . Smokeless  tobacco: Never Used  Substance Use Topics  . Alcohol use: Yes  . Drug use: No     Allergies   Ultram [tramadol], Arixtra [fondaparinux], Benzodiazepines, Codeine, Demerol [meperidine], Gabapentin, Lyrica [pregabalin], Morphine and related, and Zoloft [sertraline hcl]   Review of Systems Review of Systems Pertinent negatives listed in HPI  Physical Exam Triage Vital Signs ED Triage Vitals [05/30/19 1133]  Enc Vitals Group     BP 137/82     Pulse Rate (!) 106     Resp 16     Temp 99 F (37.2 C)     Temp Source Oral     SpO2 100 %     Weight      Height      Head Circumference      Peak Flow      Pain Score 10     Pain Loc      Pain Edu?      Excl. in  [place]?    No data found.  Updated Vital Signs BP 137/82 (BP Location: Right Arm)   Pulse (!) 106   Temp 99 F (37.2 C) (Oral)   Resp 16   SpO2 100%   Visual Acuity Right Eye Distance:   Left Eye Distance:   Bilateral Distance:    Right Eye Near:   Left Eye Near:    Bilateral Near:     Physical Exam Constitutional:      Appearance: Normal appearance.  HENT:     Head: Normocephalic.  Cardiovascular:     Rate and Rhythm: Tachycardia present.     Pulses: Normal pulses.  Pulmonary:     Breath sounds: Decreased breath sounds present.  Chest:     Chest wall: Tenderness present.     Breasts: Breasts are symmetrical.        Left: Skin change present. No swelling, inverted nipple, mass or nipple discharge.     Comments: Positive left breast erythema  Musculoskeletal:     Cervical back: Normal range of motion.  Lymphadenopathy:     Cervical: No cervical adenopathy.  Neurological:     Mental Status: She is alert.  Psychiatric:        Mood and Affect: Mood normal.        Speech: Speech normal.      UC Treatments / Results  Labs (all labs ordered are listed, but only abnormal results are displayed) Labs Reviewed - No data to display  EKG   Radiology No results found.  Procedures Procedures (including critical care time)  Medications Ordered in UC Medications - No data to display  Initial Impression / Assessment and Plan / UC Course  I have reviewed the triage vital signs and the nursing notes.  Pertinent labs & imaging results that were available during my care of the patient were reviewed by me and considered in my medical decision making (see chart for details).   ECG revealed RBBB, NSR, with left chest wall tenderness on inspiration. Patient warrants further work-up to rule out pulmonary embolism.  Patient initially tachycardic, which has since resolved. Significant discomfort with inspiratory breathing. Non-reproducible with palpation. SPO2 normal. Pt stable  for self discharge to ER for evaluation of PE. Pt declined f/u at [facility] due to wait time and is being referred to [facility] ER for further work-up and evaluation of symptoms. Red flags discussed. Pt verbalized understanding and agreement with plan. Final Clinical Impressions(s) / UC Diagnoses   Final diagnoses:  Atypical chest pain  Shortness of breath  Tachycardia  Breast pain, left     Discharge Instructions     I recommend chest angiogram to rule any acute chest process given the positional pain and pain with breathing. I have provided you a copy of EKG which is similar to your most recent tracing completed by your cardiologist.  Follow-up with your PCP to schedule an outpatient breast ultrasound given history of fibrotic cyst.  Go to [facility] ER  [address] [place], [place] [address] for further evaluation.      ED Prescriptions    None     PDMP not reviewed this encounter.   Scot Jun, FNP 05/31/19 2007    Scot Jun, [place] 06/01/19 [phone]

## 2019-05-30 NOTE — Discharge Instructions (Signed)
I recommend chest angiogram to rule any acute chest process given the positional pain and pain with breathing. I have provided you a copy of EKG which is similar to your most recent tracing completed by your cardiologist.  Follow-up with your PCP to schedule an outpatient breast ultrasound given history of fibrotic cyst.  Go to [facility] ER  [address] [place], [place] [address] for further evaluation.

## 2019-05-30 NOTE — ED Triage Notes (Signed)
Pt states that she had a colonoscopy on tues. The patient reports that when she has it she moved to her left and has a sharp pain to her left ribs region. She went to [facility] and was sent her for a PE work up  - pt states that she has intermittent chest pain to her left ribs region - she reports that she has a hx of Blood Clots

## 2019-05-30 NOTE — ED Notes (Signed)
Refused EKG  - states that she just had one at the [facility] and is worried about cost

## 2019-06-08 ENCOUNTER — Ambulatory Visit (HOSPITAL_COMMUNITY): Payer: Medicare Other | Attending: Cardiology

## 2019-06-08 ENCOUNTER — Other Ambulatory Visit: Payer: Self-pay

## 2019-06-08 DIAGNOSIS — R079 Chest pain, unspecified: Secondary | ICD-10-CM | POA: Diagnosis not present

## 2019-06-29 ENCOUNTER — Ambulatory Visit (HOSPITAL_COMMUNITY): Payer: Medicare Other

## 2019-06-29 ENCOUNTER — Encounter (HOSPITAL_COMMUNITY): Payer: Medicare Other

## 2019-06-30 ENCOUNTER — Ambulatory Visit (HOSPITAL_COMMUNITY): Payer: Medicare Other

## 2019-07-07 ENCOUNTER — Ambulatory Visit
Admission: RE | Admit: 2019-07-07 | Discharge: 2019-07-07 | Disposition: A | Discharge status: Home / Self Care | Payer: Medicare Other | Source: Ambulatory Visit | Attending: Internal Medicine | Admitting: Internal Medicine

## 2019-07-07 ENCOUNTER — Other Ambulatory Visit: Payer: Self-pay

## 2019-07-07 DIAGNOSIS — M8580 Other specified disorders of bone density and structure, unspecified site: Secondary | ICD-10-CM

## 2019-08-18 NOTE — Progress Notes (Signed)
Cardiology Office Note:   Date:  08/19/2019  NAME:  Michelle Villarreal    MRN: [id number] DOB:  May 04, 1946   PCP:  Seward Carol, MD  Cardiologist:  Evalina Field, MD   Referring MD: Seward Carol, MD   Chief Complaint  Patient presents with  . Chest Pain   History of Present Illness:   Michelle Villarreal is a 74 y.o. female with a hx of HTN, fibromyalgia who presents for follow-up of chest pain. Evaluated in January for intermittent episodes of chest pain and palpitations. Chest pressure/palpitations/SOB occurred at rest and lasted hours. Not worse with exertion. She was unable to afford copay for monitor and stress test. Echo with normal LVEF.  She reports due to financial constraints she has been unable to complete her stress test or Holter monitor.  It is reassuring that her echocardiogram was normal.  She reports she still gets her symptoms of sharp chest pain with palpitations and shortness of breath.  They are occurring less frequently.  They occur every 1 to 2 weeks.  They can last minutes to hours.  They apparently are lasting for less duration.  She reports that her primary care physician did put her on Crestor 5 mg daily.  Apparently she had some dizziness with this.  She has stopped this medication currently.  She does report that she goes to [place] and walks.  Main limitation is arthritis.  She seems to be doing well overall.  Still having issues with her daughter as well as grandson who has special needs.  They do disagree about his course of care.  She is trying to destress and do as much at home to relieve any stress.  Her blood pressures at home have been in the 110-130 range.  Blood pressure bit elevated here.  No symptoms reported.  Laboratory data from primary care office dated 04/20/2019, total cholesterol 134, HDL 63, LDL 61, triglycerides 40, hemoglobin 12.9, creatinine 0.65, TSH 1.86  Problem List 1. HTN 2. Fibromyalgia  3. Obesity BMI 35 4. CAD -CAC seen on CT A/P 5.  RBBB  Past Medical History: Past Medical History:  Diagnosis Date  . Adenoma    adrenal gland  . Bladder incontinence   . Cataracts, bilateral   . Chronic pain   . Diabetes ([place])   . Fall    09/30/13, 03/15/15, 06/14/15  . Fibromyalgia   . Hypertension   . Neuropathy   . Osteoarthritis   . Tinnitus     Past Surgical History: Past Surgical History:  Procedure Laterality Date  . BREAST REDUCTION SURGERY    . BUNIONECTOMY    . REDUCTION MAMMAPLASTY Bilateral   . REPLACEMENT TOTAL KNEE Bilateral 2003/2004  . TUBAL LIGATION    . VAGINAL HYSTERECTOMY      Current Medications: Current Meds  Medication Sig  . Cholecalciferol (VITAMIN D3 PO) Take 800 Units by mouth 3 (three) times a week.  Marland Kitchen guaiFENesin (MUCINEX) 600 MG 12 hr tablet Take by mouth 2 (two) times daily.  . Liniments (SALONPAS PAIN RELIEF PATCH EX) Apply topically daily as needed.  . Methen-Hyosc-Meth Blue-Na Phos (UROGESIC-BLUE PO) Take 1 tablet by mouth 4 (four) times daily as needed.  . naproxen sodium (ANAPROX) 220 MG tablet Take 220 mg by mouth every 8 (eight) hours as needed. AS NEEDED  . olmesartan-hydrochlorothiazide (BENICAR HCT) 20-12.5 MG tablet Take 1 tablet by mouth daily.  . rosuvastatin (CRESTOR) 5 MG tablet Take 5 mg by mouth daily.  Marland Kitchen  vitamin E 400 UNIT capsule Take 400-800 Units by mouth. 3-4 times weekly per pt     Allergies:    Ultram [tramadol], Arixtra [fondaparinux], Benzodiazepines, Codeine, Demerol [meperidine], Gabapentin, Lyrica [pregabalin], Morphine and related, and Zoloft [sertraline hcl]   Social History: Social History   Socioeconomic History  . Marital status: Single    Spouse name: Not on file  . Number of children: 1  . Years of education: Not on file  . Highest education level: Not on file  Occupational History  . Not on file  Tobacco Use  . Smoking status: Former Smoker    Years: 5.00  . Smokeless tobacco: Never Used  Substance and Sexual Activity  . Alcohol use:  Yes  . Drug use: No  . Sexual activity: Never  Other Topics Concern  . Not on file  Social History Narrative   Lives alone   Caffeine use: minimal    Social Determinants of Health   Financial Resource Strain:   . Difficulty of Paying Living Expenses:   Food Insecurity:   . Worried About Charity fundraiser in the Last Year:   . Arboriculturist in the Last Year:   Transportation Needs:   . Film/video editor (Medical):   Marland Kitchen Lack of Transportation (Non-Medical):   Physical Activity:   . Days of Exercise per Week:   . Minutes of Exercise per Session:   Stress:   . Feeling of Stress :   Social Connections:   . Frequency of Communication with Friends and Family:   . Frequency of Social Gatherings with Friends and Family:   . Attends Religious Services:   . Active Member of Clubs or Organizations:   . Attends Archivist Meetings:   Marland Kitchen Marital Status:      Family History: The patient's family history includes Arthritis in her brother; Diabetes in her sister; Ovarian cancer in her mother. There is no history of Stroke, Migraines, or Ataxia.  ROS:   All other ROS reviewed and negative. Pertinent positives noted in the HPI.     EKGs/Labs/Other Studies Reviewed:   The following studies were personally reviewed by me today:  EKG:  EKG is ordered today.  The ekg ordered today demonstrates NSR 85 bpm, RBBB, PVC, and was personally reviewed by me.   TTE 06/08/2019 1. Left ventricular ejection fraction, by visual estimation, is 55 to  60%. The left ventricle has normal function. There is no left ventricular  hypertrophy.  2. Left ventricular diastolic parameters are consistent with Grade I  diastolic dysfunction (impaired relaxation).  3. The left ventricle has no regional wall motion abnormalities.  4. Global right ventricle has normal systolic function.The right  ventricular size is normal. No increase in right ventricular wall  thickness.  5. Left atrial size  was normal.  6. Right atrial size was normal.  7. The mitral valve is normal in structure. Trivial mitral valve  regurgitation. No evidence of mitral stenosis.  8. The tricuspid valve is normal in structure. Tricuspid valve  regurgitation is trivial.  9. The aortic valve is tricuspid. Aortic valve regurgitation is not  visualized. Mild aortic valve sclerosis without stenosis.  10. The inferior vena cava is normal in size with greater than 50%  respiratory variability, suggesting right atrial pressure of 3 mmHg.  11. The tricuspid regurgitant velocity is 2.10 m/s, and with an assumed  right atrial pressure of 3 mmHg, the estimated right ventricular systolic  pressure is normal  at 20.6 mmHg.   Recent Labs: No results found for requested labs within last 8760 hours.   Recent Lipid Panel No results found for: CHOL, TRIG, HDL, CHOLHDL, VLDL, LDLCALC, LDLDIRECT  Physical Exam:   VS:  BP (!) 150/92   Pulse 85   Ht 5\' 10"  (1.778 m)   Wt 249 lb 12.8 oz (113.3 kg)   BMI 35.84 kg/m    Wt Readings from Last 3 Encounters:  08/19/19 249 lb 12.8 oz (113.3 kg)  05/30/19 245 lb 9.5 oz (111.4 kg)  05/19/19 245 lb 9.6 oz (111.4 kg)    General: Well nourished, well developed, in no acute distress Heart: Atraumatic, normal size  Eyes: PEERLA, EOMI  Neck: Supple, no JVD Endocrine: No thryomegaly Cardiac: Normal S1, S2; RRR; no murmurs, rubs, or gallops Lungs: Clear to auscultation bilaterally, no wheezing, rhonchi or rales  Abd: Soft, nontender, no hepatomegaly  Ext: No edema, pulses 2+ Musculoskeletal: No deformities, BUE and BLE strength normal and equal Skin: Warm and dry, no rashes   Neuro: Alert and oriented to person, place, time, and situation, CNII-XII grossly intact, no focal deficits  Psych: Normal mood and affect   ASSESSMENT:   Michelle Villarreal is a 74 y.o. female who presents for the following: 1. Chest pain of uncertain etiology   2. Palpitations   3. SOB (shortness of  breath)   4. Essential hypertension   5. Coronary artery disease involving native heart without angina pectoris, unspecified vessel or lesion type   6. RBBB     PLAN:   1. Chest pain of uncertain etiology 2. Palpitations 3. SOB (shortness of breath) -Still reports intermittent episodes of sharp chest pain, palpitations and shortness of breath.  Occurring less frequently.  Possibly stress related.  Financially, she is unable to proceed with nuclear medicine stress test or Holter monitor.  Given that symptoms are improving I suspect this could be anxiety or depression related.  I have encouraged her to reach out to talk with a counselor.  She will look into this.  4. Essential hypertension -Per her report blood pressure well controlled at home.  No change in medication.  5. Coronary artery disease involving native heart without angina pectoris, unspecified vessel or lesion type -She has had coronary calcium seen on a recent CT abdomen pelvis.  Her primary care physician did put her on a statin.  She is unable to tolerate this.  I have encouraged her to try different statin.  She will follow with her primary care about this.  Most recent LDL cholesterol is at goal.  Simmon HDL cholesterol noted.  6. RBBB -Normal echocardiogram.  No further work-up needed.  Disposition: Return if symptoms worsen or fail to improve.  Medication Adjustments/Labs and Tests Ordered: Current medicines are reviewed at length with the patient today.  Concerns regarding medicines are outlined above.  Orders Placed This Encounter  Procedures  . EKG 12-Lead   No orders of the defined types were placed in this encounter.  Patient Instructions  Medication Instructions:  The current medical regimen is effective;  continue present plan and medications.  *If you need a refill on your cardiac medications before your next appointment, please call your pharmacy*   Follow-Up: At [facility], you and your health  needs are our priority.  As part of our continuing mission to provide you with exceptional heart care, we have created designated Provider Care Teams.  These Care Teams include your primary Cardiologist (physician) and Advanced  Practice Providers (APPs -  Physician Assistants and Nurse Practitioners) who all work together to provide you with the care you need, when you need it.  We recommend signing up for the patient portal called "MyChart".  Sign up information is provided on this After Visit Summary.  MyChart is used to connect with patients for Virtual Visits (Telemedicine).  Patients are able to view lab/test results, encounter notes, upcoming appointments, etc.  Non-urgent messages can be sent to your provider as well.   To learn more about what you can do with MyChart, go to [email or web address].    Your next appointment:   As needed  The format for your next appointment:   In Person  Provider:   Eleonore Chiquito, MD     Time Spent with Patient: I have spent a total of 25 minutes with patient reviewing hospital notes, telemetry, EKGs, labs and examining the patient as well as establishing an assessment and plan that was discussed with the patient.  > 50% of time was spent in direct patient care.  Signed, Addison Naegeli. Audie Box, [place]  [address], [place], [place] [address] [phone]  08/19/2019 10:26 AM

## 2019-08-19 ENCOUNTER — Other Ambulatory Visit: Payer: Self-pay

## 2019-08-19 ENCOUNTER — Ambulatory Visit: Payer: Medicare Other | Admitting: Cardiovascular Disease

## 2019-08-19 ENCOUNTER — Encounter: Payer: Self-pay | Admitting: Cardiovascular Disease

## 2019-08-19 VITALS — BP 150/92 | HR 85 | Ht 70.0 in | Wt 249.8 lb

## 2019-08-19 DIAGNOSIS — R079 Chest pain, unspecified: Secondary | ICD-10-CM | POA: Diagnosis not present

## 2019-08-19 DIAGNOSIS — R0602 Shortness of breath: Secondary | ICD-10-CM | POA: Diagnosis not present

## 2019-08-19 DIAGNOSIS — R002 Palpitations: Secondary | ICD-10-CM | POA: Diagnosis not present

## 2019-08-19 DIAGNOSIS — I4510 Unspecified right bundle-branch block: Secondary | ICD-10-CM

## 2019-08-19 DIAGNOSIS — I2510 Atherosclerotic heart disease of native coronary artery without angina pectoris: Secondary | ICD-10-CM

## 2019-08-19 DIAGNOSIS — I10 Essential (primary) hypertension: Secondary | ICD-10-CM | POA: Diagnosis not present

## 2019-08-19 NOTE — Patient Instructions (Signed)
Medication Instructions:  The current medical regimen is effective;  continue present plan and medications.  *If you need a refill on your cardiac medications before your next appointment, please call your pharmacy*    Follow-Up: At [facility], you and your health needs are our priority.  As part of our continuing mission to provide you with exceptional heart care, we have created designated Provider Care Teams.  These Care Teams include your primary Cardiologist (physician) and Advanced Practice Providers (APPs -  Physician Assistants and Nurse Practitioners) who all work together to provide you with the care you need, when you need it.  We recommend signing up for the patient portal called "MyChart".  Sign up information is provided on this After Visit Summary.  MyChart is used to connect with patients for Virtual Visits (Telemedicine).  Patients are able to view lab/test results, encounter notes, upcoming appointments, etc.  Non-urgent messages can be sent to your provider as well.   To learn more about what you can do with MyChart, go to [email or web address].    Your next appointment:   As needed  The format for your next appointment:   In Person  Provider:   Sabin O'Neal, MD      

## 2019-11-24 ENCOUNTER — Ambulatory Visit: Payer: Self-pay | Admitting: Neurology

## 2019-11-26 ENCOUNTER — Other Ambulatory Visit: Payer: Self-pay

## 2019-11-26 ENCOUNTER — Encounter (HOSPITAL_COMMUNITY): Payer: Self-pay | Admitting: Emergency Medicine

## 2019-11-26 ENCOUNTER — Ambulatory Visit (HOSPITAL_COMMUNITY)
Admission: EM | Admit: 2019-11-26 | Discharge: 2019-11-26 | Disposition: A | Discharge status: Home / Self Care | Payer: Medicare Other | Attending: Internal Medicine | Admitting: Internal Medicine

## 2019-11-26 DIAGNOSIS — Y92009 Unspecified place in unspecified non-institutional (private) residence as the place of occurrence of the external cause: Secondary | ICD-10-CM

## 2019-11-26 DIAGNOSIS — S93401A Sprain of unspecified ligament of right ankle, initial encounter: Secondary | ICD-10-CM | POA: Diagnosis not present

## 2019-11-26 MED ORDER — BACLOFEN 10 MG PO TABS: 10.0000 mg | ORAL_TABLET | Freq: Three times a day (TID) | ORAL | 0 refills | Status: DC | PRN

## 2019-11-26 NOTE — ED Triage Notes (Addendum)
Patient had a fall today.  Patient was pulling weeds near a brick wall and sidewalk.  Not sure what caused fall.  No loc.  Patient has abrasions to both elbows, left wrist,  left lower leg, and  right ankle pain. Patient landed on right side of body on ground. Patient has an unsteady gate by nature, uses a cane.  However this morning she was not using cane when pulling weeds.    Patient denies hitting head or face, but 2 scratches to right cheek and across nose, chin is red, bite mark inside of lower lip

## 2019-11-26 NOTE — ED Provider Notes (Signed)
[place]    CSN: [id number] Arrival date & time: 11/26/19  1055      History   Chief Complaint Chief Complaint  Patient presents with  . Fall    HPI Michelle Villarreal is a 74 y.o. female comes to the urgent care with complaints of right ankle and right-sided body pain. Patient was pulling weeds from the yard yesterday when she lost her balance and fell. Patient fell on the right side of her body. She denies hitting her head. She did not pass out. She is experiencing left knee pain and right ankle pain. No swelling of the right ankle. Patient was able to walk and drove herself to the urgent care. She is able to bear weight on both right knee, ankle and the left knee.   HPI  Past Medical History:  Diagnosis Date  . Adenoma    adrenal gland  . Bladder incontinence   . Cataracts, bilateral   . Chronic pain   . Diabetes ([place])   . Fall    09/30/13, 03/15/15, 06/14/15  . Fibromyalgia   . Hypertension   . Neuropathy   . Osteoarthritis   . Tinnitus     Patient Active Problem List   Diagnosis Date Noted  . Subjective memory complaints 02/17/2018  . Pulsatile tinnitus of right ear 07/27/2015  . Diabetes ([place]) 07/27/2015  . Essential hypertension 07/27/2015  . Hereditary and idiopathic peripheral neuropathy 07/27/2015  . Fibromyalgia 07/27/2015    Past Surgical History:  Procedure Laterality Date  . BREAST REDUCTION SURGERY    . BUNIONECTOMY    . REDUCTION MAMMAPLASTY Bilateral   . REPLACEMENT TOTAL KNEE Bilateral 2003/2004  . TUBAL LIGATION    . VAGINAL HYSTERECTOMY      OB History   No obstetric history on file.      Home Medications    Prior to Admission medications   Medication Sig Start Date End Date Taking? Authorizing Provider  baclofen (LIORESAL) 10 MG tablet Take 1 tablet (10 mg total) by mouth 3 (three) times daily as needed for muscle spasms. 11/26/19   LampteyMyrene Galas, MD  Cholecalciferol (VITAMIN D3 PO) Take 800 Units by mouth 3 (three)  times a week.    [provider]  guaiFENesin (MUCINEX) 600 MG 12 hr tablet Take by mouth 2 (two) times daily.    [provider]  Liniments (SALONPAS PAIN RELIEF PATCH EX) Apply topically daily as needed.    [provider]  Methen-Hyosc-Meth Blue-Na Phos (UROGESIC-BLUE PO) Take 1 tablet by mouth 4 (four) times daily as needed.    [provider]  naproxen sodium (ANAPROX) 220 MG tablet Take 220 mg by mouth every 8 (eight) hours as needed. AS NEEDED    [provider]  olmesartan-hydrochlorothiazide (BENICAR HCT) 20-12.5 MG tablet Take 1 tablet by mouth daily.    [provider]  rosuvastatin (CRESTOR) 5 MG tablet Take 5 mg by mouth daily. 02/03/19   [provider]  vitamin E 400 UNIT capsule Take 400-800 Units by mouth. 3-4 times weekly per pt    [provider]    Family History Family History  Problem Relation Age of Onset  . Ovarian cancer Mother   . Diabetes Sister   . Arthritis Brother   . Stroke Neg Hx   . Migraines Neg Hx   . Ataxia Neg Hx     Social History Social History   Tobacco Use  . Smoking status: Former Smoker  Years: 5.00  . Smokeless tobacco: Never Used  Substance Use Topics  . Alcohol use: Yes  . Drug use: No     Allergies   Ultram [tramadol], Arixtra [fondaparinux], Benzodiazepines, Codeine, Demerol [meperidine], Gabapentin, Lyrica [pregabalin], Morphine and related, and Zoloft [sertraline hcl]   Review of Systems Review of Systems  Constitutional: Negative.   HENT: Negative.   Gastrointestinal: Negative.   Genitourinary: Negative.   Musculoskeletal: Positive for arthralgias and myalgias. Negative for joint swelling, neck pain and neck stiffness.  Skin: Negative.   Neurological: Negative for dizziness, light-headedness and headaches.     Physical Exam Triage Vital Signs ED Triage Vitals  Enc Vitals Group     BP 11/26/19 1231 122/82     Pulse Rate 11/26/19 1231 90      Resp 11/26/19 1231 18     Temp 11/26/19 1231 98.1 F (36.7 C)     Temp Source 11/26/19 1231 Oral     SpO2 11/26/19 1231 98 %     Weight --      Height --      Head Circumference --      Peak Flow --      Pain Score 11/26/19 1227 10     Pain Loc --      Pain Edu? --      Excl. in [place]? --    No data found.  Updated Vital Signs BP 122/82 (BP Location: Left Arm)   Pulse 90   Temp 98.1 F (36.7 C) (Oral)   Resp 18   SpO2 98%   Visual Acuity Right Eye Distance:   Left Eye Distance:   Bilateral Distance:    Right Eye Near:   Left Eye Near:    Bilateral Near:     Physical Exam Vitals and nursing note reviewed.  Constitutional:      General: She is not in acute distress.    Appearance: She is not ill-appearing.  Cardiovascular:     Rate and Rhythm: Normal rate and regular rhythm.     Pulses: Normal pulses.     Heart sounds: Normal heart sounds.  Pulmonary:     Effort: Pulmonary effort is normal.     Breath sounds: Normal breath sounds.  Abdominal:     General: Bowel sounds are normal.     Palpations: Abdomen is soft.  Musculoskeletal:        General: Swelling, tenderness and signs of injury present. Normal range of motion.  Skin:    General: Skin is warm.     Capillary Refill: Capillary refill takes less than 2 seconds.     Findings: Bruising present. No erythema.     Comments: Abrasions over the left shin, right elbow, right ala nasi.  Neurological:     Mental Status: She is alert.      UC Treatments / Results  Labs (all labs ordered are listed, but only abnormal results are displayed) Labs Reviewed - No data to display  EKG   Radiology No results found.  Procedures Procedures (including critical care time)  Medications Ordered in UC Medications - No data to display  Initial Impression / Assessment and Plan / UC Course  I have reviewed the triage vital signs and the nursing notes.  Pertinent labs & imaging results that were available during my  care of the patient were reviewed by me and considered in my medical decision making (see chart for details).     1. Right ankle sprain following a fall:  Patient has full range of motion around the right ankle and right knee. No indication for imaging Patient has Tylenol and naproxen at home Baclofen as needed for muscle spasms. Her pain is currently moderate severity Gentle range of motion exercises, keep the abrasion areas clean. Apply antibiotic ointment to the abrasions. If symptoms worsen please return to urgent care to be reevaluated. Final Clinical Impressions(s) / UC Diagnoses   Final diagnoses:  Fall in home, initial encounter  Sprain of right ankle, unspecified ligament, initial encounter   Discharge Instructions   None    ED Prescriptions    Medication Sig Dispense Auth. Provider   baclofen (LIORESAL) 10 MG tablet Take 1 tablet (10 mg total) by mouth 3 (three) times daily as needed for muscle spasms. 49 each Jaella Weinert, Myrene Galas, MD     PDMP not reviewed this encounter.   Chase Picket, MD 11/26/19 [phone]

## 2019-12-03 ENCOUNTER — Ambulatory Visit (HOSPITAL_COMMUNITY)
Admission: EM | Admit: 2019-12-03 | Discharge: 2019-12-03 | Disposition: A | Discharge status: Home / Self Care | Payer: Medicare Other | Attending: Emergency Medicine | Admitting: Emergency Medicine

## 2019-12-03 ENCOUNTER — Encounter (HOSPITAL_COMMUNITY): Payer: Self-pay

## 2019-12-03 ENCOUNTER — Ambulatory Visit (INDEPENDENT_AMBULATORY_CARE_PROVIDER_SITE_OTHER): Payer: Medicare Other

## 2019-12-03 ENCOUNTER — Other Ambulatory Visit: Payer: Self-pay

## 2019-12-03 DIAGNOSIS — R0789 Other chest pain: Secondary | ICD-10-CM

## 2019-12-03 DIAGNOSIS — W19XXXD Unspecified fall, subsequent encounter: Secondary | ICD-10-CM | POA: Diagnosis not present

## 2019-12-03 DIAGNOSIS — S2001XA Contusion of right breast, initial encounter: Secondary | ICD-10-CM

## 2019-12-03 MED ORDER — PREDNISONE 20 MG PO TABS: 40.0000 mg | ORAL_TABLET | Freq: Every day | ORAL | 0 refills | Status: AC

## 2019-12-03 NOTE — ED Triage Notes (Signed)
Patient here following up about a fall she had last week. States she is not doing as well as she wants to be. Reports she is still having more pain in the right breast and right ankle.

## 2019-12-03 NOTE — ED Provider Notes (Signed)
HPI  SUBJECTIVE:  Michelle Villarreal is a 74 y.o. female who presents with right breast bruising, diffuse right breast and chest wall pain after having a mechanical fall last week.  she describes the pain as stabbing, constant, dull, achy, getting worse last night.  She denies coughing, wheezing.  Reports shortness of breath secondary to pain.  No hemoptysis, fevers.  She has been taking Naprosyn, half baclofen, 50 mg of Benadryl with some improvement in her symptoms.  She has also tried using a pillow.  Symptoms worse with movement, deep breath, torso rotation.  She was seen here 1 week ago after this fall.  She was complaining of right ankle and right sided body pain.  She met the [place] ankle rules, imaging was deferred.  Ultimately thought to have a right ankle sprain.  She was sent home with Tylenol, Naprosyn, baclofen.  She states that the ankle is getting better.  She has a past medical history of hypertension, fibromyalgia, [place] risk at osteoporosis.  Denies history of diabetes.  No history of pulmonary disease, smoking.  EHO:ZYYQMG, Jori Moll, MD   Past Medical History:  Diagnosis Date  . Adenoma    adrenal gland  . Bladder incontinence   . Cataracts, bilateral   . Chronic pain   . Diabetes ([place])   . Fall    09/30/13, 03/15/15, 06/14/15  . Fibromyalgia   . Hypertension   . Neuropathy   . Osteoarthritis   . Tinnitus     Past Surgical History:  Procedure Laterality Date  . BREAST REDUCTION SURGERY    . BUNIONECTOMY    . REDUCTION MAMMAPLASTY Bilateral   . REPLACEMENT TOTAL KNEE Bilateral 2003/2004  . TUBAL LIGATION    . VAGINAL HYSTERECTOMY      Family History  Problem Relation Age of Onset  . Ovarian cancer Mother   . Diabetes Sister   . Arthritis Brother   . Stroke Neg Hx   . Migraines Neg Hx   . Ataxia Neg Hx     Social History   Tobacco Use  . Smoking status: Former Smoker    Years: 5.00  . Smokeless tobacco: Never Used  Substance Use Topics  . Alcohol use: Yes   . Drug use: No    No current facility-administered medications for this encounter.  Current Outpatient Medications:  .  baclofen (LIORESAL) 10 MG tablet, Take 1 tablet (10 mg total) by mouth 3 (three) times daily as needed for muscle spasms., Disp: 30 each, Rfl: 0 .  Cholecalciferol (VITAMIN D3 PO), Take 800 Units by mouth 3 (three) times a week., Disp: , Rfl:  .  guaiFENesin (MUCINEX) 600 MG 12 hr tablet, Take by mouth 2 (two) times daily., Disp: , Rfl:  .  Liniments (SALONPAS PAIN RELIEF PATCH EX), Apply topically daily as needed., Disp: , Rfl:  .  Methen-Hyosc-Meth Blue-Na Phos (UROGESIC-BLUE PO), Take 1 tablet by mouth 4 (four) times daily as needed., Disp: , Rfl:  .  naproxen sodium (ANAPROX) 220 MG tablet, Take 220 mg by mouth every 8 (eight) hours as needed. AS NEEDED, Disp: , Rfl:  .  olmesartan-hydrochlorothiazide (BENICAR HCT) 20-12.5 MG tablet, Take 1 tablet by mouth daily., Disp: , Rfl:  .  predniSONE (DELTASONE) 20 MG tablet, Take 2 tablets (40 mg total) by mouth daily with breakfast for 5 days., Disp: 10 tablet, Rfl: 0 .  rosuvastatin (CRESTOR) 5 MG tablet, Take 5 mg by mouth daily., Disp: , Rfl:  .  vitamin E 400  UNIT capsule, Take 400-800 Units by mouth. 3-4 times weekly per pt, Disp: , Rfl:   Allergies  Allergen Reactions  . Ultram [Tramadol] Shortness Of Breath  . Arixtra [Fondaparinux] Other (See Comments)    Increased bleeding  . Benzodiazepines Other (See Comments)    Short term memory loss  . Codeine Itching    Can take with benadryl in certain time length  . Demerol [Meperidine] Nausea Only  . Gabapentin Other (See Comments)    Memory loss  . Lyrica [Pregabalin] Other (See Comments)    Memory loss  . Morphine And Related Hives  . Zoloft [Sertraline Hcl] Other (See Comments)    Memory loss     ROS  As noted in HPI.   Physical Exam  BP 127/82   Pulse 82   Temp 98.1 F (36.7 C)   Resp 17   SpO2 98%   Constitutional: Well developed, well  nourished, no acute distress Eyes:  EOMI, conjunctiva normal bilaterally HENT: Normocephalic, atraumatic,mucus membranes moist Respiratory: Normal inspiratory effort, lungs clear bilaterally.  Pain aggravated with torso rotation, movement. Chest: See skin exam.  Positive right-sided tenderness over the posterior upper ribs, multiple anterior upper ribs.  No appreciable crepitus. Cardiovascular: Normal rate, regular rhythm GI: nondistended skin: 2 healing contusions anterior right breast Musculoskeletal: no deformities Neurologic: Alert & oriented x 3, no focal neuro deficits Psychiatric: Speech and behavior appropriate   ED Course   Medications - No data to display  Orders Placed This Encounter  Procedures  . DG Ribs Unilateral W/Chest Right    Standing Status:   Standing    Number of Occurrences:   1    Order Specific Question:   Reason for Exam (SYMPTOM  OR DIAGNOSIS REQUIRED)    Answer:   Status post fall, rule out rib fracture, pneumothorax, pleural effusion    No results found for this or any previous visit (from the past 24 hour(s)). DG Ribs Unilateral W/Chest Right  Result Date: 12/03/2019 CLINICAL DATA:  Right anterior chest wall pain under the breast. [place] last week. EXAM: RIGHT RIBS AND CHEST - 3+ VIEW COMPARISON:  Chest x-ray dated May 30, 2019. FINDINGS: No fracture or other bone lesions are seen involving the ribs. There is no evidence of pneumothorax or pleural effusion. Both lungs are clear. Heart size and mediastinal contours are within normal limits. IMPRESSION: Negative. Electronically Signed   By: Titus Dubin M.D.   On: 12/03/2019 12:20    ED Clinical Impression  1. Contusion of right breast, initial encounter   2. Fall, subsequent encounter      ED Assessment/Plan  Previous records reviewed.  As noted in HPI.  We will get right rib series to rule out fracture.  Her primary complaint is the right breast.  We did not discuss the ankle as she states  that is getting better.  She is requesting a prednisone taper.  She states that she cannot take any narcotics.  Reviewed imaging independently.  No fracture, pleural effusion, pneumothorax.  See radiology report for full details.  Patient with contusion to the right breast.  Continue Aleve twice daily, baclofen.  May add 1000 mg of Tylenol 3-4 times a day.  Will send home with 5 days of prednisone 40 mg for inflammation.  Discussed imaging, MDM, treatment plan, and plan for follow-up with patient. Discussed sn/sx that should prompt return to the ED. patient agrees with plan.   Meds ordered this encounter  Medications  . predniSONE (DELTASONE)  20 MG tablet    Sig: Take 2 tablets (40 mg total) by mouth daily with breakfast for 5 days.    Dispense:  10 tablet    Refill:  0    *This clinic note was created using [employer]. Therefore, there may be occasional mistakes despite careful proofreading.   ?    Melynda Ripple, MD 12/04/19 1315

## 2019-12-03 NOTE — Discharge Instructions (Addendum)
May apply heat or ice, whichever feels better.  Continue the 2 Aleve twice a day, baclofen.  Prednisone will help with the inflammation.  You may be sore for another week or 2.  Your chest x-ray was negative for fracture or damage to your lung.  Immediately to the ER for fevers above 100.4, coughing up blood, shortness of breath, or for any other concerns

## 2020-05-25 DIAGNOSIS — Z0000 Encounter for general adult medical examination without abnormal findings: Secondary | ICD-10-CM | POA: Diagnosis not present

## 2020-05-25 DIAGNOSIS — I10 Essential (primary) hypertension: Secondary | ICD-10-CM | POA: Diagnosis not present

## 2020-05-25 DIAGNOSIS — I83892 Varicose veins of left lower extremities with other complications: Secondary | ICD-10-CM | POA: Diagnosis not present

## 2020-05-25 DIAGNOSIS — G4733 Obstructive sleep apnea (adult) (pediatric): Secondary | ICD-10-CM | POA: Diagnosis not present

## 2020-05-25 DIAGNOSIS — I2510 Atherosclerotic heart disease of native coronary artery without angina pectoris: Secondary | ICD-10-CM | POA: Diagnosis not present

## 2020-05-25 DIAGNOSIS — E7800 Pure hypercholesterolemia, unspecified: Secondary | ICD-10-CM | POA: Diagnosis not present

## 2020-05-25 DIAGNOSIS — I700 Atherosclerosis of aorta: Secondary | ICD-10-CM | POA: Diagnosis not present

## 2020-05-25 DIAGNOSIS — R6889 Other general symptoms and signs: Secondary | ICD-10-CM | POA: Diagnosis not present

## 2020-05-25 DIAGNOSIS — F324 Major depressive disorder, single episode, in partial remission: Secondary | ICD-10-CM | POA: Diagnosis not present

## 2020-06-13 ENCOUNTER — Other Ambulatory Visit: Payer: Self-pay | Admitting: Internal Medicine

## 2020-06-13 ENCOUNTER — Ambulatory Visit
Admission: RE | Admit: 2020-06-13 | Discharge: 2020-06-13 | Disposition: A | Discharge status: Home / Self Care | Payer: Medicare Other | Source: Ambulatory Visit | Attending: Internal Medicine | Admitting: Internal Medicine

## 2020-06-13 DIAGNOSIS — R1084 Generalized abdominal pain: Secondary | ICD-10-CM

## 2020-06-13 DIAGNOSIS — R109 Unspecified abdominal pain: Secondary | ICD-10-CM | POA: Diagnosis not present

## 2020-06-13 DIAGNOSIS — R6889 Other general symptoms and signs: Secondary | ICD-10-CM | POA: Diagnosis not present

## 2020-06-13 DIAGNOSIS — R1032 Left lower quadrant pain: Secondary | ICD-10-CM | POA: Diagnosis not present

## 2020-07-19 DIAGNOSIS — G479 Sleep disorder, unspecified: Secondary | ICD-10-CM | POA: Insufficient documentation

## 2020-07-19 DIAGNOSIS — I700 Atherosclerosis of aorta: Secondary | ICD-10-CM | POA: Insufficient documentation

## 2020-07-19 DIAGNOSIS — G4733 Obstructive sleep apnea (adult) (pediatric): Secondary | ICD-10-CM | POA: Insufficient documentation

## 2020-07-19 DIAGNOSIS — F324 Major depressive disorder, single episode, in partial remission: Secondary | ICD-10-CM | POA: Insufficient documentation

## 2020-07-19 DIAGNOSIS — Z860100 Personal history of colon polyps, unspecified: Secondary | ICD-10-CM | POA: Insufficient documentation

## 2020-07-19 DIAGNOSIS — F419 Anxiety disorder, unspecified: Secondary | ICD-10-CM | POA: Insufficient documentation

## 2020-07-19 DIAGNOSIS — H9319 Tinnitus, unspecified ear: Secondary | ICD-10-CM | POA: Insufficient documentation

## 2020-07-19 DIAGNOSIS — E7800 Pure hypercholesterolemia, unspecified: Secondary | ICD-10-CM | POA: Insufficient documentation

## 2020-07-19 DIAGNOSIS — M810 Age-related osteoporosis without current pathological fracture: Secondary | ICD-10-CM | POA: Insufficient documentation

## 2020-07-19 DIAGNOSIS — I83892 Varicose veins of left lower extremities with other complications: Secondary | ICD-10-CM | POA: Insufficient documentation

## 2020-07-20 ENCOUNTER — Other Ambulatory Visit: Payer: Self-pay

## 2020-07-20 ENCOUNTER — Ambulatory Visit (INDEPENDENT_AMBULATORY_CARE_PROVIDER_SITE_OTHER): Payer: Medicare HMO | Admitting: Nurse Practitioner

## 2020-07-20 ENCOUNTER — Encounter: Payer: Self-pay | Admitting: Nurse Practitioner

## 2020-07-20 VITALS — BP 137/76 | HR 85 | Ht 70.0 in | Wt 239.0 lb

## 2020-07-20 DIAGNOSIS — M2042 Other hammer toe(s) (acquired), left foot: Secondary | ICD-10-CM | POA: Diagnosis not present

## 2020-07-20 DIAGNOSIS — G4733 Obstructive sleep apnea (adult) (pediatric): Secondary | ICD-10-CM

## 2020-07-20 DIAGNOSIS — Z131 Encounter for screening for diabetes mellitus: Secondary | ICD-10-CM

## 2020-07-20 DIAGNOSIS — R6889 Other general symptoms and signs: Secondary | ICD-10-CM | POA: Diagnosis not present

## 2020-07-20 DIAGNOSIS — I700 Atherosclerosis of aorta: Secondary | ICD-10-CM | POA: Diagnosis not present

## 2020-07-20 DIAGNOSIS — F324 Major depressive disorder, single episode, in partial remission: Secondary | ICD-10-CM

## 2020-07-20 DIAGNOSIS — M2041 Other hammer toe(s) (acquired), right foot: Secondary | ICD-10-CM | POA: Diagnosis not present

## 2020-07-20 DIAGNOSIS — Z7689 Persons encountering health services in other specified circumstances: Secondary | ICD-10-CM

## 2020-07-20 DIAGNOSIS — I10 Essential (primary) hypertension: Secondary | ICD-10-CM | POA: Diagnosis not present

## 2020-07-20 MED ORDER — EZETIMIBE 10 MG PO TABS: 10.0000 mg | ORAL_TABLET | Freq: Every day | ORAL | 11 refills | Status: DC

## 2020-07-20 NOTE — Progress Notes (Signed)
[facility] [place], [place]  [address] Phone:  [phone]   Fax:  [phone]   New Patient Office Visit  Subjective:  Patient ID: Michelle Villarreal, female    DOB: 14-Dec-1945  Age: 75 y.o. MRN: [id number]  CC:  Chief Complaint  Patient presents with  . Establish Care    HPI Michelle Villarreal presents to establish care.  She.  has a past medical history of Adenoma, Bladder incontinence, Cataracts, bilateral, Chronic pain, Diabetes ([place]), Fall, Fibromyalgia, Hypertension, Neuropathy, Osteoarthritis, and Tinnitus.   She was on Crestor 5 mg for one year and was having strange symptoms. Sudden onset of fatigue, shortness of breath and tachycardia at random. She discontinued the Crestor and her symptoms resolved. She was then placed on Lipitor and she has noticed the same similar symptoms. She did undergo complete cardiac work-up with EKG in echocardiogram.  These results are normal. She did not have the myocardial perfusion scan due to the cost.  She does have records for her last total cholesterol was 201 LDL of 143.  She admits that she does not eat foods.  She does like cheese and crackers because they are convenient.  She would like to see if there is another option for the treatment of her cholesterol.  Hypertension Patient is here for follow-up of elevated blood pressure. She is not exercising due to her arthritis multiple sites and is somewhat adherent to a low-salt diet. Blood pressure is well controlled at home. Cardiac symptoms: Occasional fatigue, shortness of breath, any tachycardia. Patient denies chest pain, claudication, irregular heart beat, near-syncope, orthopnea, palpitations, paroxysmal nocturnal dyspnea, syncope and tachypnea. Cardiovascular risk factors: advanced age (older than 55 for men, 87 for women), dyslipidemia, hypertension, obesity (BMI >= 30 kg/m2) and sedentary lifestyle. Use of agents associated with hypertension: NSAIDS. History of  target organ damage: none.   Past Medical History:  Diagnosis Date  . Adenoma    adrenal gland  . Bladder incontinence   . Cataracts, bilateral   . Chronic pain   . Diabetes ([place])   . Fall    09/30/13, 03/15/15, 06/14/15  . Fibromyalgia   . Hypertension   . Neuropathy   . Osteoarthritis   . Tinnitus     Past Surgical History:  Procedure Laterality Date  . BREAST REDUCTION SURGERY    . BUNIONECTOMY    . REDUCTION MAMMAPLASTY Bilateral   . REPLACEMENT TOTAL KNEE Bilateral 2003/2004  . TUBAL LIGATION    . VAGINAL HYSTERECTOMY      Family History  Problem Relation Age of Onset  . Ovarian cancer Mother   . Diabetes Sister   . Arthritis Brother   . Stroke Neg Hx   . Migraines Neg Hx   . Ataxia Neg Hx     Social History   Socioeconomic History  . Marital status: Single    Spouse name: Not on file  . Number of children: 1  . Years of education: Not on file  . Highest education level: Not on file  Occupational History  . Not on file  Tobacco Use  . Smoking status: Former Smoker    Years: 5.00  . Smokeless tobacco: Never Used  Substance and Sexual Activity  . Alcohol use: Yes  . Drug use: No  . Sexual activity: Never  Other Topics Concern  . Not on file  Social History Narrative   Lives alone   Caffeine use: minimal    Social  Determinants of Health   Financial Resource Strain: Not on file  Food Insecurity: Not on file  Transportation Needs: Not on file  Physical Activity: Not on file  Stress: Not on file  Social Connections: Not on file  Intimate Partner Violence: Not on file    ROS Review of Systems  Cardiovascular:       Vascular surgery ("vein stripping") varicose vein  fibrocystic breast   Musculoskeletal: Positive for arthralgias, back pain (lumbar spine ) and neck pain (cervial disc ).  Neurological: Positive for numbness (in the left shoulders).    Objective:   Today's Vitals: BP 137/76   Pulse 85   Ht 5\' 10"  (1.778 m)   Wt 239 lb  (108.4 kg)   SpO2 96%   BMI 34.29 kg/m   Physical Exam Constitutional:      General: She is not in acute distress.    Appearance: She is not ill-appearing, toxic-appearing or diaphoretic.  HENT:     Head: Normocephalic and atraumatic.     Right Ear: Tympanic membrane normal.     Left Ear: Tympanic membrane normal.     Nose: Nose normal.     Mouth/Throat:     Mouth: Mucous membranes are moist.     Pharynx: Oropharynx is clear.     Comments: Multiple previous extractions with a dental carie  Eyes:     Comments: Eyeglasses  Cardiovascular:     Rate and Rhythm: Normal rate and regular rhythm.     Pulses: Normal pulses.          Dorsalis pedis pulses are 2+ on the right side and 2+ on the left side.       Posterior tibial pulses are 2+ on the right side and 2+ on the left side.     Heart sounds: Normal heart sounds.  Pulmonary:     Effort: Pulmonary effort is normal.     Breath sounds: Normal breath sounds.  Abdominal:     General: Bowel sounds are normal.     Palpations: Abdomen is soft.  Musculoskeletal:     Cervical back: Normal range of motion.     Right foot: Decreased range of motion. Deformity present.     Left foot: Decreased range of motion. Deformity present.  Feet:     Right foot:     Protective Sensation: 10 sites tested. 8 sites sensed.     Skin integrity: Skin integrity normal.     Left foot:     Protective Sensation: 10 sites tested. 8 sites sensed.     Skin integrity: Skin integrity normal.  Skin:    General: Skin is warm and dry.  Neurological:     Mental Status: She is alert.     Assessment & Plan:   Problem List Items Addressed This Visit      Cardiovascular and Mediastinum   Hardening of the aorta (main artery of the heart) (HCC) Trial of Zetia    Relevant Medications   ezetimibe (ZETIA) 10 MG tablet   Essential hypertension Encouraged on going compliance with current medication regimen Encouraged home monitoring and recording BP  <130/80 Eating a heart-healthy diet with less salt Encouraged regular physical activity  Recommend Weight loss   Relevant Medications   ezetimibe (ZETIA) 10 MG tablet     Respiratory   Obstructive sleep apnea syndrome     Other   Major depression single episode, in partial remission ([place])   Morbid obesity ([place]) Obesity with BMI and comorbidities  as noted above.  Discussed proper diet (low fat, low sodium, [place] fiber) with patient.   Discussed need for regular exercise (3 times per week, 20 minutes per session) with patient.     Other Visit Diagnoses    Encounter to establish care    -  Primary Discussed female health maintenance; SBE, annual CBE, PAP test Discussed general safety in vehicle and COVID Discussed regular hydration with water Discussed healthy diet and exercise and weight management Discussed sexual health  Discussed mental health Encouraged to call our office for an appointment with in ongoing concerns for questions.     Diabetes mellitus screening          Outpatient Encounter Medications as of 07/20/2020  Medication Sig  . Camphor-Menthol-Methyl Sal (SALONPAS EX) Apply topically.  . Chlorpheniramine Maleate (ALLERGY PO) Take by mouth.  . Cholecalciferol (VITAMIN D3 PO) Take 800 Units by mouth 3 (three) times a week.  . diclofenac (VOLTAREN) 0.1 % ophthalmic solution 4 (four) times daily.  Mar[place] Kitchen ezetimibe (ZETIA) 10 MG tablet Take 1 tablet (10 mg total) by mouth daily.  . Ginkgo Biloba 40 MG TABS Take by mouth.  [place] Kitchen guaiFENesin (MUCINEX) 600 MG 12 hr tablet Take by mouth 2 (two) times daily.  . Liniments (SALONPAS PAIN RELIEF PATCH EX) Apply topically daily as needed.  . Methen-Hyosc-Meth Blue-Na Phos (UROGESIC-BLUE PO) Take 1 tablet by mouth 4 (four) times daily as needed.  . Methenamine-Sodium Salicylate (CYSTEX PO) Take by mouth.  . naproxen sodium (ANAPROX) 220 MG tablet Take 220 mg by mouth every 8 (eight) hours as needed. AS NEEDED  .  olmesartan-hydrochlorothiazide (BENICAR HCT) 20-12.5 MG tablet Take 1 tablet by mouth daily.  . vitamin E 400 UNIT capsule Take 400-800 Units by mouth. 3-4 times weekly per pt  . [DISCONTINUED] atorvastatin (LIPITOR) 10 MG tablet Take 10 mg by mouth daily.  . [DISCONTINUED] baclofen (LIORESAL) 10 MG tablet Take 1 tablet (10 mg total) by mouth 3 (three) times daily as needed for muscle spasms.  . [DISCONTINUED] rosuvastatin (CRESTOR) 5 MG tablet Take 5 mg by mouth daily.   No facility-administered encounter medications on file as of 07/20/2020.    Follow-up: Return in about 3 months (around 10/20/2020) for Follow up HTN [id number].   Michelle Francois, NP

## 2020-07-20 NOTE — Patient Instructions (Signed)
Health Maintenance, Female Adopting a healthy lifestyle and getting preventive care are important in promoting health and wellness. Ask your health care provider about:  The right schedule for you to have regular tests and exams.  Things you can do on your own to prevent diseases and keep yourself healthy. What should I know about diet, weight, and exercise? Eat a healthy diet  Eat a diet that includes plenty of vegetables, fruits, low-fat dairy products, and lean protein.  Do not eat a lot of foods that are [place] in solid fats, added sugars, or sodium.   Maintain a healthy weight Body mass index (BMI) is used to identify weight problems. It estimates body fat based on height and weight. Your health care provider can help determine your BMI and help you achieve or maintain a healthy weight. Get regular exercise Get regular exercise. This is one of the most important things you can do for your health. Most adults should:  Exercise for at least 150 minutes each week. The exercise should increase your heart rate and make you sweat (moderate-intensity exercise).  Do strengthening exercises at least twice a week. This is in addition to the moderate-intensity exercise.  Spend less time sitting. Even light physical activity can be beneficial. Watch cholesterol and blood lipids Have your blood tested for lipids and cholesterol at 74 years of age, then have this test every 5 years. Have your cholesterol levels checked more often if:  Your lipid or cholesterol levels are [place].  You are older than 74 years of age.  You are at [place] risk for heart disease. What should I know about cancer screening? Depending on your health history and family history, you may need to have cancer screening at various ages. This may include screening for:  Breast cancer.  Cervical cancer.  Colorectal cancer.  Skin cancer.  Lung cancer. What should I know about heart disease, diabetes, and [place] blood  pressure? Blood pressure and heart disease  [place] blood pressure causes heart disease and increases the risk of stroke. This is more likely to develop in people who have [place] blood pressure readings, are of African descent, or are overweight.  Have your blood pressure checked: ? Every 3-5 years if you are 18-39 years of age. ? Every year if you are 40 years old or older. Diabetes Have regular diabetes screenings. This checks your fasting blood sugar level. Have the screening done:  Once every three years after age 40 if you are at a normal weight and have a low risk for diabetes.  More often and at a younger age if you are overweight or have a [place] risk for diabetes. What should I know about preventing infection? Hepatitis B If you have a higher risk for hepatitis B, you should be screened for this virus. Talk with your health care provider to find out if you are at risk for hepatitis B infection. Hepatitis C Testing is recommended for:  Everyone born from 1945 through 1965.  Anyone with known risk factors for hepatitis C. Sexually transmitted infections (STIs)  Get screened for STIs, including gonorrhea and chlamydia, if: ? You are sexually active and are younger than 74 years of age. ? You are older than 74 years of age and your health care provider tells you that you are at risk for this type of infection. ? Your sexual activity has changed since you were last screened, and you are at increased risk for chlamydia or gonorrhea. Ask your health care provider   if you are at risk.  Ask your health care provider about whether you are at [place] risk for HIV. Your health care provider may recommend a prescription medicine to help prevent HIV infection. If you choose to take medicine to prevent HIV, you should first get tested for HIV. You should then be tested every 3 months for as long as you are taking the medicine. Pregnancy  If you are about to stop having your period (premenopausal) and  you may become pregnant, seek counseling before you get pregnant.  Take 400 to 800 micrograms (mcg) of folic acid every day if you become pregnant.  Ask for birth control (contraception) if you want to prevent pregnancy. Osteoporosis and menopause Osteoporosis is a disease in which the bones lose minerals and strength with aging. This can result in bone fractures. If you are 65 years old or older, or if you are at risk for osteoporosis and fractures, ask your health care provider if you should:  Be screened for bone loss.  Take a calcium or vitamin D supplement to lower your risk of fractures.  Be given hormone replacement therapy (HRT) to treat symptoms of menopause. Follow these instructions at home: Lifestyle  Do not use any products that contain nicotine or tobacco, such as cigarettes, e-cigarettes, and chewing tobacco. If you need help quitting, ask your health care provider.  Do not use street drugs.  Do not share needles.  Ask your health care provider for help if you need support or information about quitting drugs. Alcohol use  Do not drink alcohol if: ? Your health care provider tells you not to drink. ? You are pregnant, may be pregnant, or are planning to become pregnant.  If you drink alcohol: ? Limit how much you use to 0-1 drink a day. ? Limit intake if you are breastfeeding.  Be aware of how much alcohol is in your drink. In the [place] one drink equals one 12 oz bottle of beer (355 mL), one 5 oz glass of wine (148 mL), or one 1 oz glass of hard liquor (44 mL). General instructions  Schedule regular health, dental, and eye exams.  Stay current with your vaccines.  Tell your health care provider if: ? You often feel depressed. ? You have ever been abused or do not feel safe at home. Summary  Adopting a healthy lifestyle and getting preventive care are important in promoting health and wellness.  Follow your health care provider's instructions about healthy  diet, exercising, and getting tested or screened for diseases.  Follow your health care provider's instructions on monitoring your cholesterol and blood pressure. This information is not intended to replace advice given to you by your health care provider. Make sure you discuss any questions you have with your health care provider. Document Revised: 04/23/2018 Document Reviewed: 04/23/2018 Elsevier Patient Education  2021 [employer]  

## 2020-10-21 ENCOUNTER — Ambulatory Visit (INDEPENDENT_AMBULATORY_CARE_PROVIDER_SITE_OTHER): Payer: Medicare HMO | Admitting: Nurse Practitioner

## 2020-10-21 VITALS — BP 136/78 | HR 81 | Temp 97.8°F | Resp 18 | Ht 70.0 in | Wt 241.0 lb

## 2020-10-21 DIAGNOSIS — Z0000 Encounter for general adult medical examination without abnormal findings: Secondary | ICD-10-CM | POA: Diagnosis not present

## 2020-10-21 NOTE — Progress Notes (Signed)
Subjective:   Michelle Villarreal is a 75 y.o. female who presents for Medicare Annual (Subsequent) preventive examination.  Review of Systems    Review of Systems  Constitutional: Negative.   HENT: Negative.    Eyes: Negative.   Respiratory: Negative.    Cardiovascular: Negative.   Gastrointestinal: Negative.   Genitourinary: Negative.   Musculoskeletal: Negative.   Skin: Negative.   Neurological: Negative.   Endo/Heme/Allergies: Negative.   Psychiatric/Behavioral: Negative.     Cardiac Risk Factors include: advanced age (>98men, >43 women);obesity (BMI >30kg/m2)     Objective:    Today's Vitals   10/21/20 1002  BP: 136/78  Pulse: 81  Resp: 18  Temp: 97.8 F (36.6 C)  SpO2: 97%  Weight: 241 lb (109.3 kg)  Height: 5\' 10"  (1.778 m)   Body mass index is 34.58 kg/m.  Advanced Directives 05/30/2019 07/20/2015  Does Patient Have a Medical Advance Directive? No No  Would patient like information on creating a medical advance directive? No - Patient declined -    Current Medications (verified) Outpatient Encounter Medications as of 10/21/2020  Medication Sig   Camphor-Menthol-Methyl Sal (SALONPAS EX) Apply topically.   Chlorpheniramine Maleate (ALLERGY PO) Take by mouth.   Cholecalciferol (VITAMIN D3 PO) Take 800 Units by mouth 3 (three) times a week.   ezetimibe (ZETIA) 10 MG tablet Take 1 tablet (10 mg total) by mouth daily.   Ginkgo Biloba 40 MG TABS Take by mouth.   guaiFENesin (MUCINEX) 600 MG 12 hr tablet Take by mouth 2 (two) times daily.   Liniments (SALONPAS PAIN RELIEF PATCH EX) Apply topically daily as needed.   Methen-Hyosc-Meth Blue-Na Phos (UROGESIC-BLUE PO) Take 1 tablet by mouth 4 (four) times daily as needed.   Methenamine-Sodium Salicylate (CYSTEX PO) Take by mouth.   naproxen sodium (ANAPROX) 220 MG tablet Take 220 mg by mouth every 8 (eight) hours as needed. AS NEEDED   vitamin E 400 UNIT capsule Take 400-800 Units by mouth. 3-4 times weekly per pt    [DISCONTINUED] diclofenac (VOLTAREN) 0.1 % ophthalmic solution 4 (four) times daily.   [DISCONTINUED] olmesartan-hydrochlorothiazide (BENICAR HCT) 20-12.5 MG tablet Take 1 tablet by mouth daily. (Patient not taking: Reported on 10/21/2020)   No facility-administered encounter medications on file as of 10/21/2020.    Allergies (verified) Ultram [tramadol], Acetazolamide, Benzodiazepines, Codeine, Demerol [meperidine], Duloxetine hcl, Fentanyl, Fondaparinux, Gabapentin, Lyrica [pregabalin], Morphine and related, Oxycodone-acetaminophen, Pentazocine, and Zoloft [sertraline hcl]   History: Past Medical History:  Diagnosis Date   Adenoma    adrenal gland   Bladder incontinence    Cataracts, bilateral    Chronic pain    Diabetes ([place])    Fall    09/30/13, 03/15/15, 06/14/15   Fibromyalgia    Hypertension    Neuropathy    Osteoarthritis    Tinnitus    Past Surgical History:  Procedure Laterality Date   BREAST REDUCTION SURGERY     BUNIONECTOMY     REDUCTION MAMMAPLASTY Bilateral    REPLACEMENT TOTAL KNEE Bilateral 2003/2004   TUBAL LIGATION     VAGINAL HYSTERECTOMY     Family History  Problem Relation Age of Onset   Ovarian cancer Mother    Diabetes Sister    Arthritis Brother    Stroke Neg Hx    Migraines Neg Hx    Ataxia Neg Hx    Social History   Socioeconomic History   Marital status: Single    Spouse name: Not on file   Number of  children: 1   Years of education: Not on file   Highest education level: Not on file  Occupational History   Not on file  Tobacco Use   Smoking status: Former    Years: 5.00    Pack years: 0.00    Types: Cigarettes   Smokeless tobacco: Never  Substance and Sexual Activity   Alcohol use: Yes   Drug use: No   Sexual activity: Never  Other Topics Concern   Not on file  Social History Narrative   Lives alone   Caffeine use: minimal    Social Determinants of Health   Financial Resource Strain: Low Risk    Difficulty of Paying  Living Expenses: Not hard at all  Food Insecurity: No Food Insecurity   Worried About Charity fundraiser in the Last Year: Never true   [place] in the Last Year: Never true  Transportation Needs: No Transportation Needs   Lack of Transportation (Medical): No   Lack of Transportation (Non-Medical): No  Physical Activity: Insufficiently Active   Days of Exercise per Week: 1 day   Minutes of Exercise per Session: 30 min  Stress: Stress Concern Present   Feeling of Stress : Very much  Social Connections: Unknown   Frequency of Communication with Friends and Family: More than three times a week   Frequency of Social Gatherings with Friends and Family: More than three times a week   Attends Religious Services: 1 to 4 times per year   Active Member of [employer] or Organizations: No   Attends Music therapist: Never   Marital Status: Patient refused    Tobacco Counseling Counseling given: Yes   Clinical Intake:  Pre-visit preparation completed: No  Pain : No/denies pain     BMI - recorded: 34.58 Nutritional Status: BMI > 30  Obese Nutritional Risks: None Diabetes: No  How often do you need to have someone help you when you read instructions, pamphlets, or other written materials from your doctor or pharmacy?: 1 - Never What is the last grade level you completed in school?: 4 year degree  Diabetic?no  Interpreter Needed?: No      Activities of Daily Living In your present state of health, do you have any difficulty performing the following activities: 10/21/2020  Hearing? N  Vision? N  Difficulty concentrating or making decisions? N  Walking or climbing stairs? Y  Dressing or bathing? N  Doing errands, shopping? N  Preparing Food and eating ? N  Using the Toilet? N  In the past six months, have you accidently leaked urine? N  Do you have problems with loss of bowel control? N  Managing your Medications? N  Managing your Finances? N  Housekeeping  or managing your Housekeeping? N  Some recent data might be hidden    Patient Care Team: Vevelyn Francois, NP as PCP - General (Adult Health Nurse Practitioner)  Indicate any recent Medical Services you may have received from other than [facility] providers in the past year (date may be approximate).     Assessment:   This is a routine wellness examination for Leeta.  Hearing/Vision screen No results found.  Dietary issues and exercise activities discussed: Current Exercise Habits: Home exercise routine, Time (Minutes): 30, Frequency (Times/Week): 5, Weekly Exercise (Minutes/Week): 150, Exercise limited by: None identified   Goals Addressed             This Visit's Progress    DIET - REDUCE FAT  INTAKE       DIET - REDUCE FAT INTAKE         Depression Screen PHQ 2/9 Scores 10/21/2020  PHQ - 2 Score 2  PHQ- 9 Score 8    Fall Risk Fall Risk  10/21/2020  Falls in the past year? 0  Number falls in past yr: 0  Injury with Fall? 0  Risk for fall due to : History of fall(s)  Follow up Education provided    [place]:  Any stairs in or around the home? Yes  If so, are there any without handrails? Yes  Home free of loose throw rugs in walkways, pet beds, electrical cords, etc? Yes  Adequate lighting in your home to reduce risk of falls? Yes   ASSISTIVE DEVICES UTILIZED TO PREVENT FALLS:  Life alert? No  Use of a cane, walker or w/c? Yes  Grab bars in the bathroom? Yes  Shower chair or bench in shower? Yes  Elevated toilet seat or a handicapped toilet? Yes   TIMED UP AND GO:  Was the test performed? Yes .  Length of time to ambulate 10 feet: 5 sec.   Gait steady and fast with assistive device  Cognitive Function: MMSE - Mini Mental State Exam 10/21/2020 02/17/2018 12/28/2015  Not completed: - (No Data) -  Orientation to time 5 5 5   Orientation to Place 5 5 5   Registration 3 3 3   Attention/ Calculation 5 2 5   Recall 3 3 2    Language- name 2 objects 2 2 2   Language- repeat 1 1 1   Language- follow 3 step command 3 3 3   Language- read & follow direction 1 1 1   Write a sentence 1 1 1   Copy design 1 1 1   Total score 30 27 29         Immunizations Immunization History  Administered Date(s) Administered   Influenza Split 02/19/2013, 04/19/2014, 01/17/2015, 01/21/2016, 02/07/2017, 01/22/2018, 01/30/2019, 01/14/2020   Influenza, Marcoux Dose Seasonal PF 02/07/2017   PFIZER(Purple Top)SARS-COV-2 Vaccination 06/25/2019, 07/20/2019, 03/11/2020   Pneumococcal Conjugate-13 01/27/2014   Pneumococcal Polysaccharide-23 01/28/2013   Tdap 12/02/2017    TDAP status: Up to date  Flu Vaccine status: Up to date  Pneumococcal vaccine status: Up to date  Covid-19 vaccine status: Completed vaccines  Qualifies for Shingles Vaccine? Yes   Zostavax completed No   Shingrix Completed?: No.    Education has been provided regarding the importance of this vaccine. Patient has been advised to call insurance company to determine out of pocket expense if they have not yet received this vaccine. Advised may also receive vaccine at local pharmacy or Health Dept. Verbalized acceptance and understanding.  Screening Tests Health Maintenance  Topic Date Due   HEMOGLOBIN A1C  Never done   URINE MICROALBUMIN  Never done   Zoster Vaccines- Shingrix (1 of 2) Never done   COVID-19 Vaccine (4 - Booster for Pfizer series) 07/11/2020   OPHTHALMOLOGY EXAM  10/12/2020   MAMMOGRAM  07/20/2021 (Originally 06/06/2017)   Hepatitis C Screening  07/20/2021 (Originally 01/29/1964)   INFLUENZA VACCINE  12/12/2020   FOOT EXAM  07/20/2021   TETANUS/TDAP  12/03/2027   COLONOSCOPY (Pts 45-11yrs Insurance coverage will need to be confirmed)  05/25/2029   DEXA SCAN  Completed   PNA vac Low Risk Adult  Completed   HPV VACCINES  Aged Out    Health Maintenance  Health Maintenance Due  Topic Date Due   HEMOGLOBIN A1C  Never done   URINE MICROALBUMIN   Never done   Zoster Vaccines- Shingrix (1 of 2) Never done   COVID-19 Vaccine (4 - Booster for Pfizer series) 07/11/2020   OPHTHALMOLOGY EXAM  10/12/2020    Colorectal cancer screening: Type of screening: Colonoscopy. Completed  . Repeat every 10 years  Mammogram status: Completed  . Repeat every year  Done density complete  Lung Cancer Screening: (Low Dose CT Chest recommended if Age 59-80 years, 30 pack-year currently smoking OR have quit w/in 15years.) does not qualify.   Lung Cancer Screening Referral: na   Additional Screening:  Hepatitis C Screening: does qualify; Completed   Vision Screening: Recommended annual ophthalmology exams for early detection of glaucoma and other disorders of the eye. Is the patient up to date with their annual eye exam?  Yes  Who is the provider or what is the name of the office in which the patient attends annual eye exams? [facility] If pt is not established with a provider, would they like to be referred to a provider to establish care?  a .   Dental Screening: Recommended annual dental exams for proper oral hygiene  Community Resource Referral / Chronic Care Management: CRR required this visit?  No   CCM required this visit?  No      Plan:     I have personally reviewed and noted the following in the patient's chart:   Medical and social history Use of alcohol, tobacco or illicit drugs  Current medications and supplements including opioid prescriptions.  Functional ability and status Nutritional status Physical activity Advanced directives List of other physicians Hospitalizations, surgeries, and ER visits in previous 12 months Vitals Screenings to include cognitive, depression, and falls Referrals and appointments  In addition, I have reviewed and discussed with patient certain preventive protocols, quality metrics, and best practice recommendations. A written personalized care plan for preventive services as well as  general preventive health recommendations were provided to patient.     Fenton Foy, NP   10/21/2020

## 2020-10-21 NOTE — Patient Instructions (Addendum)
Michelle Villarreal , Thank you for taking time to come for your Medicare Wellness Visit. I appreciate your ongoing commitment to your health goals. Please review the following plan we discussed and let me know if I can assist you in the future.   These are the goals we discussed:  Goals   None     This is a list of the screening recommended for you and due dates:  Health Maintenance  Topic Date Due   Hemoglobin A1C  Never done   Zoster (Shingles) Vaccine (1 of 2) Never done   COVID-19 Vaccine (4 - Booster for Pfizer series) 07/11/2020   Eye exam for diabetics  10/12/2020   Mammogram  07/20/2021*   Hepatitis C Screening: USPSTF Recommendation to screen - Ages 18-79 yo.  07/20/2021*   Flu Shot  12/12/2020   Complete foot exam   07/20/2021   Tetanus Vaccine  12/03/2027   Colon Cancer Screening  05/25/2029   DEXA scan (bone density measurement)  Completed   Pneumonia vaccines  Completed   HPV Vaccine  Aged Out  *Topic was postponed. The date shown is not the original due date.    Fat and Cholesterol Restricted Eating Plan Getting too much fat and cholesterol in your diet may cause health problems. Choosing the right foods helps keep your fat and cholesterol at normal levels.This can keep you from getting certain diseases. Your doctor may recommend an eating plan that includes: Total fat: ______% or less of total calories a day. Saturated fat: ______% or less of total calories a day. Cholesterol: less than _________mg a day. Fiber: ______g a day. What are tips for following this plan? Meal planning At meals, divide your plate into four equal parts: Fill one-half of your plate with vegetables and green salads. Fill one-fourth of your plate with whole grains. Fill one-fourth of your plate with low-fat (lean) protein foods. Eat fish that is Jonas in omega-3 fats at least two times a week. This includes mackerel, tuna, sardines, and salmon. Eat foods that are Upp[place] in fiber, such as whole  grains, beans, apples, broccoli, carrots, peas, and barley. General tips  Work with your doctor to lose weight if you need to. Avoid: Foods with added sugar. Fried foods. Foods with partially hydrogenated oils. Limit alcohol intake to no more than 1 drink a day for nonpregnant women and 2 drinks a day for men. One drink equals 12 oz of beer, 5 oz of wine, or 1 oz of hard liquor.  Reading food labels Check food labels for: Trans fats. Partially hydrogenated oils. Saturated fat (g) in each serving. Cholesterol (mg) in each serving. Fiber (g) in each serving. Choose foods with healthy fats, such as: Monounsaturated fats. Polyunsaturated fats. Omega-3 fats. Choose grain products that have whole grains. Look for the word "whole" as the first word in the ingredient list. Cooking Cook foods using low-fat methods. These include baking, boiling, grilling, and broiling. Eat more home-cooked foods. Eat at restaurants and buffets less often. Avoid cooking using saturated fats, such as butter, cream, palm oil, palm kernel oil, and coconut oil. Recommended foods  Fruits All fresh, canned (in natural juice), or frozen fruits. Vegetables Fresh or frozen vegetables (raw, steamed, roasted, or grilled). Green salads. Grains Whole grains, such as whole wheat or whole grain breads, crackers, cereals, and pasta. Unsweetened oatmeal, bulgur, barley, quinoa, or brown rice. Corn or whole wheat flour tortillas. Meats and other protein foods Ground beef (85% or leaner), grass-fed beef, or beef  trimmed of fat. Skinless chicken or [place]. Ground chicken or [place]. Pork trimmed of fat. All fish and seafood. Egg whites. Dried beans, peas, or lentils. Unsalted nuts or seeds. Unsalted canned beans. Nut butters without added sugar or oil. Dairy Low-fat or nonfat dairy products, such as skim or 1% milk, 2% or reduced-fat cheeses, low-fat and fat-free ricotta or cottage cheese, or plain low-fat and nonfat  yogurt. Fats and oils Tub margarine without trans fats. Light or reduced-fat mayonnaise and salad dressings. Avocado. Olive, canola, sesame, or safflower oils. The items listed above may not be a complete list of foods and beverages youcan eat. Contact a dietitian for more information. Foods to avoid Fruits Canned fruit in heavy syrup. Fruit in cream or butter sauce. Fried fruit. Vegetables Vegetables cooked in cheese, cream, or butter sauce. Fried vegetables. Grains White bread. White pasta. White rice. Cornbread. Bagels, pastries, and croissants. Crackers and snack foods that contain trans fat and hydrogenated oils. Meats and other protein foods Fatty cuts of meat. Ribs, chicken wings, bacon, sausage, bologna, salami, chitterlings, fatback, hot dogs, bratwurst, and packaged lunch meats. Liver and organ meats. Whole eggs and egg yolks. Chicken and [place] with skin. Fried meat. Dairy Whole or 2% milk, cream, half-and-half, and cream cheese. Whole milk cheeses. Whole-fat or sweetened yogurt. Full-fat cheeses. Nondairy creamers and whipped toppings. Processed cheese, cheese spreads, and cheese curds. Beverages Alcohol. Sugar-sweetened drinks such as sodas, lemonade, and fruit drinks. Fats and oils Butter, stick margarine, lard, shortening, ghee, or bacon fat. Coconut, palm kernel, and palm oils. Sweets and desserts Corn syrup, sugars, honey, and molasses. Candy. Jam and jelly. Syrup. Sweetened cereals. Cookies, pies, cakes, donuts, muffins, and ice cream. The items listed above may not be a complete list of foods and beverages youshould avoid. Contact a dietitian for more information. Summary Choosing the right foods helps keep your fat and cholesterol at normal levels. This can keep you from getting certain diseases. At meals, fill one-half of your plate with vegetables and green salads. Eat [place]-fiber foods, like whole grains, beans, apples, carrots, peas, and barley. Limit added sugar,  saturated fats, alcohol, and fried foods. This information is not intended to replace advice given to you by your health care provider. Make sure you discuss any questions you have with your healthcare provider. Document Revised: 09/02/2019 Document Reviewed: 09/02/2019 Elsevier Patient Education  2022 [place] Prevention in the Home, Adult Falls can cause injuries and can happen to people of all ages. There are many things you can do to make your home safe and to help prevent falls. Ask forhelp when making these changes. What actions can I take to prevent falls? General Instructions Use good lighting in all rooms. Replace any light bulbs that burn out. Turn on the lights in dark areas. Use night-lights. Keep items that you use often in easy-to-reach places. Lower the shelves around your home if needed. Set up your furniture so you have a clear path. Avoid moving your furniture around. Do not have throw rugs or other things on the floor that can make you trip. Avoid walking on wet floors. If any of your floors are uneven, fix them. Add color or contrast paint or tape to clearly mark and help you see: Grab bars or handrails. First and last steps of staircases. Where the edge of each step is. If you use a stepladder: Make sure that it is fully opened. Do not climb a closed stepladder. Make sure the sides of the  stepladder are locked in place. Ask someone to hold the stepladder while you use it. Know where your pets are when moving through your home. What can I do in the bathroom?     Keep the floor dry. Clean up any water on the floor right away. Remove soap buildup in the tub or shower. Use nonskid mats or decals on the floor of the tub or shower. Attach bath mats securely with double-sided, nonslip rug tape. If you need to sit down in the shower, use a plastic, nonslip stool. Install grab bars by the toilet and in the tub and shower. Do not use towel bars as grab  bars. What can I do in the bedroom? Make sure that you have a light by your bed that is easy to reach. Do not use any sheets or blankets for your bed that hang to the floor. Have a firm chair with side arms that you can use for support when you get dressed. What can I do in the kitchen? Clean up any spills right away. If you need to reach something above you, use a step stool with a grab bar. Keep electrical cords out of the way. Do not use floor polish or wax that makes floors slippery. What can I do with my stairs? Do not leave any items on the stairs. Make sure that you have a light switch at the top and the bottom of the stairs. Make sure that there are handrails on both sides of the stairs. Fix handrails that are broken or loose. Install nonslip stair treads on all your stairs. Avoid having throw rugs at the top or bottom of the stairs. Choose a carpet that does not hide the edge of the steps on the stairs. Check carpeting to make sure that it is firmly attached to the stairs. Fix carpet that is loose or worn. What can I do on the outside of my home? Use bright outdoor lighting. Fix the edges of walkways and driveways and fix any cracks. Remove anything that might make you trip as you walk through a door, such as a raised step or threshold. Trim any bushes or trees on paths to your home. Check to see if handrails are loose or broken and that both sides of all steps have handrails. Install guardrails along the edges of any raised decks and porches. Clear paths of anything that can make you trip, such as tools or rocks. Have leaves, snow, or ice cleared regularly. Use sand or salt on paths during winter. Clean up any spills in your garage right away. This includes grease or oil spills. What other actions can I take? Wear shoes that: Have a low heel. Do not wear Bach heels. Have rubber bottoms. Feel good on your feet and fit well. Are closed at the toe. Do not wear open-toe  sandals. Use tools that help you move around if needed. These include: Canes. Walkers. Scooters. Crutches. Review your medicines with your doctor. Some medicines can make you feel dizzy. This can increase your chance of falling. Ask your doctor what else you can do to help prevent falls. Where to find more information Centers for Disease Control and Prevention, [place]: http://www.wolf.info/ National Institute on Aging: http://kim-miller.com/ Contact a doctor if: You are afraid of falling at home. You feel weak, drowsy, or dizzy at home. You fall at home. Summary There are many simple things that you can do to make your home safe and to help prevent falls. Ways to make  your home safe include removing things that can make you trip and installing grab bars in the bathroom. Ask for help when making these changes in your home. This information is not intended to replace advice given to you by your health care provider. Make sure you discuss any questions you have with your healthcare provider. Document Revised: 12/02/2019 Document Reviewed: 12/02/2019 Elsevier Patient Education  [place] Maintenance, Female Adopting a healthy lifestyle and getting preventive care are important in promoting health and wellness. Ask your health care provider about: The right schedule for you to have regular tests and exams. Things you can do on your own to prevent diseases and keep yourself healthy. What should I know about diet, weight, and exercise? Eat a healthy diet  Eat a diet that includes plenty of vegetables, fruits, low-fat dairy products, and lean protein. Do not eat a lot of foods that are B[place] in solid fats, added sugars, or sodium.  Maintain a healthy weight Body mass index (BMI) is used to identify weight problems. It estimates body fat based on height and weight. Your health care provider can help determineyour BMI and help you achieve or maintain a healthy weight. Get regular  exercise Get regular exercise. This is one of the most important things you can do for your health. Most adults should: Exercise for at least 150 minutes each week. The exercise should increase your heart rate and make you sweat (moderate-intensity exercise). Do strengthening exercises at least twice a week. This is in addition to the moderate-intensity exercise. Spend less time sitting. Even light physical activity can be beneficial. Watch cholesterol and blood lipids Have your blood tested for lipids and cholesterol at 74 years of age, then havethis test every 5 years. Have your cholesterol levels checked more often if: Your lipid or cholesterol levels are [place]. You are older than 74 years of age. You are at [place] risk for heart disease. What should I know about cancer screening? Depending on your health history and family history, you may need to have cancer screening at various ages. This may include screening for: Breast cancer. Cervical cancer. Colorectal cancer. Skin cancer. Lung cancer. What should I know about heart disease, diabetes, and Sporrer blood pressure? Blood pressure and heart disease Dark blood pressure causes heart disease and increases the risk of stroke. This is more likely to develop in people who have Dimare blood pressure readings, are of African descent, or are overweight. Have your blood pressure checked: Every 3-5 years if you are 67-19 years of age. Every year if you are 20 years old or older. Diabetes Have regular diabetes screenings. This checks your fasting blood sugar level. Have the screening done: Once every three years after age 26 if you are at a normal weight and have a low risk for diabetes. More often and at a younger age if you are overweight or have a [place] risk for diabetes. What should I know about preventing infection? Hepatitis B If you have a higher risk for hepatitis B, you should be screened for this virus. Talk with your health care provider  to find out if you are at risk forhepatitis B infection. Hepatitis C Testing is recommended for: Everyone born from [birth year] through 1964. Anyone with known risk factors for hepatitis C. Sexually transmitted infections (STIs) Get screened for STIs, including gonorrhea and chlamydia, if: You are sexually active and are younger than 74 years of age. You are older than 74 years of age and  your health care provider tells you that you are at risk for this type of infection. Your sexual activity has changed since you were last screened, and you are at increased risk for chlamydia or gonorrhea. Ask your health care provider if you are at risk. Ask your health care provider about whether you are at Hora risk for HIV. Your health care provider may recommend a prescription medicine to help prevent HIV infection. If you choose to take medicine to prevent HIV, you should first get tested for HIV. You should then be tested every 3 months for as long as you are taking the medicine. Pregnancy If you are about to stop having your period (premenopausal) and you may become pregnant, seek counseling before you get pregnant. Take 400 to 800 micrograms (mcg) of folic acid every day if you become pregnant. Ask for birth control (contraception) if you want to prevent pregnancy. Osteoporosis and menopause Osteoporosis is a disease in which the bones lose minerals and strength with aging. This can result in bone fractures. If you are 35 years old or older, or if you are at risk for osteoporosis and fractures, ask your health care provider if you should: Be screened for bone loss. Take a calcium or vitamin D supplement to lower your risk of fractures. Be given hormone replacement therapy (HRT) to treat symptoms of menopause. Follow these instructions at home: Lifestyle Do not use any products that contain nicotine or tobacco, such as cigarettes, e-cigarettes, and chewing tobacco. If you need help quitting, ask your health  care provider. Do not use street drugs. Do not share needles. Ask your health care provider for help if you need support or information about quitting drugs. Alcohol use Do not drink alcohol if: Your health care provider tells you not to drink. You are pregnant, may be pregnant, or are planning to become pregnant. If you drink alcohol: Limit how much you use to 0-1 drink a day. Limit intake if you are breastfeeding. Be aware of how much alcohol is in your drink. In the [place] one drink equals one 12 oz bottle of beer (355 mL), one 5 oz glass of wine (148 mL), or one 1 oz glass of hard liquor (44 mL). General instructions Schedule regular health, dental, and eye exams. Stay current with your vaccines. Tell your health care provider if: You often feel depressed. You have ever been abused or do not feel safe at home. Summary Adopting a healthy lifestyle and getting preventive care are important in promoting health and wellness. Follow your health care provider's instructions about healthy diet, exercising, and getting tested or screened for diseases. Follow your health care provider's instructions on monitoring your cholesterol and blood pressure. This information is not intended to replace advice given to you by your health care provider. Make sure you discuss any questions you have with your healthcare provider. Document Revised: 04/23/2018 Document Reviewed: 04/23/2018 Elsevier Patient Education  2022 [place].  Steps to Quit Smoking Smoking tobacco is the leading cause of preventable death. It can affect almost every organ in the body. Smoking puts you and those around you at risk for developing many serious chronic diseases. Quitting smoking can be difficult, but it is one of the best things that you can do for your health. It is nevertoo late to quit. How do I get ready to quit? When you decide to quit smoking, create a plan to help you succeed. Before you quit: Pick a date to  quit. Set a  date within the next 2 weeks to give you time to prepare. Write down the reasons why you are quitting. Keep this list in places where you will see it often. Tell your family, friends, and co-workers that you are quitting. Support from your loved ones can make quitting easier. Talk with your health care provider about your options for quitting smoking. Find out what treatment options are covered by your health insurance. Identify people, places, things, and activities that make you want to smoke (triggers). Avoid them. What first steps can I take to quit smoking? Throw away all cigarettes at home, at work, and in your car. Throw away smoking accessories, such as Scientist, research (medical). Clean your car. Make sure to empty the ashtray. Clean your home, including curtains and carpets. What strategies can I use to quit smoking? Talk with your health care provider about combining strategies, such as taking medicines while you are also receiving in-person counseling. Using these two strategies together makes you more likely to succeed in quitting than if you used either strategy on its own. If you are pregnant or breastfeeding, talk with your health care provider about finding counseling or other support strategies to quit smoking. Do not take medicine to help you quit smoking unless your health care provider tells you to do so. To quit smoking: Quit right away Quit smoking completely, instead of gradually reducing how much you smoke over a period of time. Research shows that stopping smoking right away is more successful than gradually quitting. Attend in-person counseling to help you build problem-solving skills. You are more likely to succeed in quitting if you attend counseling sessions regularly. Even short sessions of 10 minutes can be effective. Take medicine You may take medicines to help you quit smoking. Some medicines require a prescription and some you can purchase over-the-counter.  Medicines may have nicotine in them to replace the nicotine in cigarettes. Medicines may: Help to stop cravings. Help to relieve withdrawal symptoms. Your health care provider may recommend: Nicotine patches, gum, or lozenges. Nicotine inhalers or sprays. Non-nicotine medicine that is taken by mouth. Find resources Find resources and support systems that can help you to quit smoking and remain smoke-free after you quit. These resources are most helpful when you use them often. They include: Online chats with a Social worker. Telephone quitlines. Printed Furniture conservator/restorer. Support groups or group counseling. Text messaging programs. Mobile phone apps or applications. Use apps that can help you stick to your quit plan by providing reminders, tips, and encouragement. There are many free apps for mobile devices as well as websites. Examples include Quit Guide from the [employer] and smokefree.gov What things can I do to make it easier to quit?  Reach out to your family and friends for support and encouragement. Call telephone quitlines (1-800-QUIT-NOW), reach out to support groups, or work with a counselor for support. Ask people who smoke to avoid smoking around you. Avoid places that trigger you to smoke, such as bars, parties, or smoke-break areas at work. Spend time with people who do not smoke. Lessen the stress in your life. Stress can be a smoking trigger for some people. To lessen stress, try: Exercising regularly. Doing deep-breathing exercises. Doing yoga. Meditating. Performing a body scan. This involves closing your eyes, scanning your body from head to toe, and noticing which parts of your body are particularly tense. Try to relax the muscles in those areas. How will I feel when I quit smoking? Day 1 to 3 weeks Within  the first 24 hours of quitting smoking, you may start to feel withdrawal symptoms. These symptoms are usually most noticeable 2-3 days after quitting, but they usually do  not last for more than 2-3 weeks. You may experience these symptoms: Mood swings. Restlessness, anxiety, or irritability. Trouble concentrating. Dizziness. Strong cravings for sugary foods and nicotine. Mild weight gain. Constipation. Nausea. Coughing or a sore throat. Changes in how the medicines that you take for unrelated issues work in your body. Depression. Trouble sleeping (insomnia). Week 3 and afterward After the first 2-3 weeks of quitting, you may start to notice more positive results, such as: Improved sense of smell and taste. Decreased coughing and sore throat. Slower heart rate. Lower blood pressure. Clearer skin. The ability to breathe more easily. Fewer sick days. Quitting smoking can be very challenging. Do not get discouraged if you are not successful the first time. Some people need to make many attempts to quit before they achieve long-term success. Do your best to stick to your quit plan, and talk with your health care provider if you haveany questions or concerns. Summary Smoking tobacco is the leading cause of preventable death. Quitting smoking is one of the best things that you can do for your health. When you decide to quit smoking, create a plan to help you succeed. Quit smoking right away, not slowly over a period of time. When you start quitting, seek help from your health care provider, family, or friends. This information is not intended to replace advice given to you by your health care provider. Make sure you discuss any questions you have with your healthcare provider. Document Revised: 01/23/2019 Document Reviewed: 07/19/2018 Elsevier Patient Education  2022 [facility]   September 9-10, 2022 [facility] located  at [address], [place], [place] [address]  Services are free, provided by volunteer licensed providers Patients are  treated on a first come, first served basis beginning at 6:00 am each day.  Each patient will receive a health screening, x-rays, and exam Offered dental procedures may include cleanings, fillings, teeth pulling or partial dentures for front teeth.  [email or web address]/#[email or web address]  Questions? Call [phone]   [place] hygiene students at [facility] perform basic dental care at a low cost.   Services Oral prophylaxis (teeth cleaning) Pit and fissure sealants Dental x-rays Fluoride treatments Plaque control instruction  To make an appointment call [phone] ext. [phone].  Dr. Tressie Ellis B. [address] [place], [place] [address]

## 2020-10-24 ENCOUNTER — Encounter: Payer: Self-pay | Admitting: Nurse Practitioner

## 2020-10-24 ENCOUNTER — Other Ambulatory Visit: Payer: Self-pay

## 2020-10-24 ENCOUNTER — Ambulatory Visit (INDEPENDENT_AMBULATORY_CARE_PROVIDER_SITE_OTHER): Payer: Medicare HMO | Admitting: Nurse Practitioner

## 2020-10-24 VITALS — BP 122/75 | HR 82 | Temp 98.2°F | Ht 70.0 in | Wt 238.0 lb

## 2020-10-24 DIAGNOSIS — I83892 Varicose veins of left lower extremities with other complications: Secondary | ICD-10-CM

## 2020-10-24 DIAGNOSIS — M2041 Other hammer toe(s) (acquired), right foot: Secondary | ICD-10-CM | POA: Diagnosis not present

## 2020-10-24 DIAGNOSIS — M79671 Pain in right foot: Secondary | ICD-10-CM | POA: Diagnosis not present

## 2020-10-24 DIAGNOSIS — Z9109 Other allergy status, other than to drugs and biological substances: Secondary | ICD-10-CM | POA: Diagnosis not present

## 2020-10-24 DIAGNOSIS — M2042 Other hammer toe(s) (acquired), left foot: Secondary | ICD-10-CM | POA: Diagnosis not present

## 2020-10-24 DIAGNOSIS — I10 Essential (primary) hypertension: Secondary | ICD-10-CM | POA: Diagnosis not present

## 2020-10-24 DIAGNOSIS — E7800 Pure hypercholesterolemia, unspecified: Secondary | ICD-10-CM

## 2020-10-24 NOTE — Patient Instructions (Signed)
Arthritis Arthritis is a term that is commonly used to refer to joint pain or jointdisease. There are more than 100 types of arthritis. What are the causes? The most common cause of this condition is wear and tear of a joint. Other causes include: Gout. Inflammation of a joint. An infection of a joint. Sprains and other injuries near the joint. A reaction to medicines or drugs, or an allergic reaction. In some cases, the cause may not be known. What are the signs or symptoms? The main symptom of this condition is pain in the joint during movement. Other symptoms include: Redness, swelling, or stiffness at a joint. Warmth coming from the joint. Fever. Overall feeling of illness. How is this diagnosed? This condition may be diagnosed with a physical exam and tests, including: Blood tests. Urine tests. Imaging tests, such as X-rays, an MRI, or a CT scan. Sometimes, fluid is removed from a joint for testing. How is this treated? This condition may be treated with: Treatment of the cause, if it is known. Rest. Raising (elevating) the joint. Applying cold or hot packs to the joint. Medicines to improve symptoms and reduce inflammation. Injections of a steroid such as cortisone into the joint to help reduce pain and inflammation. Depending on the cause of your arthritis, you may need to make lifestyle changes to reduce stress on your joint. Changes may include: Exercising more. Losing weight. Follow these instructions at home: Medicines Take over-the-counter and prescription medicines only as told by your health care provider. Do not take aspirin to relieve pain if your health care provider thinks that gout may be causing your pain. Activity Rest your joint if told by your health care provider. Rest is important when your disease is active and your joint feels painful, swollen, or stiff. Avoid activities that make the pain worse. It is important to balance activity with  rest. Exercise your joint regularly with range-of-motion exercises as told by your health care provider. Try doing low-impact exercise, such as: Swimming. Water aerobics. Biking. Walking. Managing pain, stiffness, and swelling     If directed, put ice on the joint. Put ice in a plastic bag. Place a towel between your skin and the bag. Leave the ice on for 20 minutes, 2-3 times per day. If your joint is swollen, raise (elevate) it above the level of your heart if directed by your health care provider. If your joint feels stiff in the morning, try taking a warm shower. If directed, apply heat to the affected area as often as told by your health care provider. Use the heat source that your health care provider recommends, such as a moist heat pack or a heating pad. If you have diabetes, do not apply heat without permission from your health care provider. To apply heat: Place a towel between your skin and the heat source. Leave the heat on for 20-30 minutes. Remove the heat if your skin turns bright red. This is especially important if you are unable to feel pain, heat, or cold. You may have a greater risk of getting burned. General instructions Do not use any products that contain nicotine or tobacco, such as cigarettes, e-cigarettes, and chewing tobacco. If you need help quitting, ask your health care provider. Keep all follow-up visits as told by your health care provider. This is important. Contact a health care provider if: The pain gets worse. You have a fever. Get help right away if: You develop severe joint pain, swelling, or redness. Many joints   become painful and swollen. You develop severe back pain. You develop severe weakness in your leg. You cannot control your bladder or bowels. Summary Arthritis is a term that is commonly used to refer to joint pain or joint disease. There are more than 100 types of arthritis. The most common cause of this condition is wear and tear of a  joint. Other causes include gout, inflammation or infection of the joint, sprains, or allergies. Symptoms of this condition include redness, swelling, or stiffness of the joint. Other symptoms include warmth, fever, or feeling ill. This condition is treated with rest, elevation, medicines, and applying cold or hot packs. Follow your health care provider's instructions about medicines, activity, exercises, and other home care treatments. This information is not intended to replace advice given to you by your health care provider. Make sure you discuss any questions you have with your healthcare provider. Document Revised: 04/07/2018 Document Reviewed: 04/07/2018 Elsevier Patient Education  2022 [place] Following a healthy eating pattern may help you to achieve and maintain a healthy body weight, reduce the risk of chronic disease, and live a long and productive life. It is important to follow a healthy eating pattern at an appropriate calorie level for your body. Your nutritional needs should be metprimarily through food by choosing a variety of nutrient-rich foods. What are tips for following this plan? Reading food labels Read labels and choose the following: Reduced or low sodium. Juices with 100% fruit juice. Foods with low saturated fats and [place] polyunsaturated and monounsaturated fats. Foods with whole grains, such as whole wheat, cracked wheat, brown rice, and wild rice. Whole grains that are fortified with folic acid. This is recommended for women who are pregnant or who want to become pregnant. Read labels and avoid the following: Foods with a lot of added sugars. These include foods that contain brown sugar, corn sweetener, corn syrup, dextrose, fructose, glucose, Knepp-fructose corn syrup, honey, invert sugar, lactose, malt syrup, maltose, molasses, raw sugar, sucrose, trehalose, or turbinado sugar. Do not eat more than the following amounts of added sugar per  day: 6 teaspoons (25 g) for women. 9 teaspoons (38 g) for men. Foods that contain processed or refined starches and grains. Refined grain products, such as white flour, degermed cornmeal, white bread, and white rice. Shopping Choose nutrient-rich snacks, such as vegetables, whole fruits, and nuts. Avoid [place]-calorie and [place]-sugar snacks, such as potato chips, fruit snacks, and candy. Use oil-based dressings and spreads on foods instead of solid fats such as butter, stick margarine, or cream cheese. Limit pre-made sauces, mixes, and "instant" products such as flavored rice, instant noodles, and ready-made pasta. Try more plant-protein sources, such as tofu, tempeh, black beans, edamame, lentils, nuts, and seeds. Explore eating plans such as the Mediterranean diet or vegetarian diet. Cooking Use oil to saut or stir-fry foods instead of solid fats such as butter, stick margarine, or lard. Try baking, boiling, grilling, or broiling instead of frying. Remove the fatty part of meats before cooking. Steam vegetables in water or broth. Meal planning  At meals, imagine dividing your plate into fourths: One-half of your plate is fruits and vegetables. One-fourth of your plate is whole grains. One-fourth of your plate is protein, especially lean meats, poultry, eggs, tofu, beans, or nuts. Include low-fat dairy as part of your daily diet.  Lifestyle Choose healthy options in all settings, including home, work, school, restaurants, or stores. Prepare your food safely: Wash your hands after handling raw meats. Keep  food preparation surfaces clean by regularly washing with hot, soapy water. Keep raw meats separate from ready-to-eat foods, such as fruits and vegetables. Cook seafood, meat, poultry, and eggs to the recommended internal temperature. Store foods at safe temperatures. In general: Keep cold foods at 5F (4.4C) or below. Keep hot foods at 15F (60C) or above. Keep your freezer at  [facility] (-17.8C) or below. Foods are no longer safe to eat when they have been between the temperatures of 40-15F (4.4-60C) for more than 2 hours. What foods should I eat? Fruits Aim to eat 2 cup-equivalents of fresh, canned (in natural juice), or frozen fruits each day. Examples of 1 cup-equivalent of fruit include 1 small apple, 8large strawberries, 1 cup canned fruit,  cup dried fruit, or 1 cup 100% juice. Vegetables Aim to eat 2-3 cup-equivalents of fresh and frozen vegetables each day, including different varieties and colors. Examples of 1 cup-equivalent of vegetables include 2 medium carrots, 2 cups raw, leafy greens, 1 cup choppedvegetable (raw or cooked), or 1 medium baked potato. Grains Aim to eat 6 ounce-equivalents of whole grains each day. Examples of 1 ounce-equivalent of grains include 1 slice of bread, 1 cup ready-to-eat cereal,3 cups popcorn, or  cup cooked rice, pasta, or cereal. Meats and other proteins Aim to eat 5-6 ounce-equivalents of protein each day. Examples of 1 ounce-equivalent of protein include 1 egg, 1/2 cup nuts or seeds, or 1 tablespoon (16 g) peanut butter. A cut of meat or fish that is the size of a deck of cards is about 3-4 ounce-equivalents. Of the protein you eat each week, try to have at least 8 ounces come from seafood. This includes salmon, trout, herring, and anchovies. Dairy Aim to eat 3 cup-equivalents of fat-free or low-fat dairy each day. Examples of 1 cup-equivalent of dairy include 1 cup (240 mL) milk, 8 ounces (250 g) yogurt,1 ounces (44 g) natural cheese, or 1 cup (240 mL) fortified soy milk. Fats and oils Aim for about 5 teaspoons (21 g) per day. Choose monounsaturated fats, such as canola and olive oils, avocados, peanut butter, and most nuts, or polyunsaturated fats, such as sunflower, corn, and soybean oils, walnuts, pine nuts, sesame seeds, sunflower seeds, and flaxseed. Beverages Aim for six 8-oz glasses of water per day. Limit coffee to  three to five 8-oz cups per day. Limit caffeinated beverages that have added calories, such as soda and energy drinks. Limit alcohol intake to no more than 1 drink a day for nonpregnant women and 2 drinks a day for men. One drink equals 12 oz of beer (355 mL), 5 oz of wine (148 mL), or 1 oz of hard liquor (44 mL). Seasoning and other foods Avoid adding excess amounts of salt to your foods. Try flavoring foods with herbs and spices instead of salt. Avoid adding sugar to foods. Try using oil-based dressings, sauces, and spreads instead of solid fats. This information is based on general [place] nutrition guidelines. For more information, visit [employer]. Exact amounts may vary based on your nutrition needs. Summary A healthy eating plan may help you to maintain a healthy weight, reduce the risk of chronic diseases, and stay active throughout your life. Plan your meals. Make sure you eat the right portions of a variety of nutrient-rich foods. Try baking, boiling, grilling, or broiling instead of frying. Choose healthy options in all settings, including home, work, school, restaurants, or stores. This information is not intended to replace advice given to you by your health care provider.  Make sure you discuss any questions you have with your healthcare provider. Document Revised: 08/12/2017 Document Reviewed: 08/12/2017 Elsevier Patient Education  [place] Pain Many things can cause foot pain. Some common causes are: An injury. A sprain. Arthritis. Blisters. Bunions. Follow these instructions at home: Managing pain, stiffness, and swelling If directed, put ice on the painful area: Put ice in a plastic bag. Place a towel between your skin and the bag. Leave the ice on for 20 minutes, 2-3 times a day.  Activity Do not stand or walk for long periods. Return to your normal activities as told by your health care provider. Ask your health care provider what activities are  safe for you. Do stretches to relieve foot pain and stiffness as told by your health care provider. Do not lift anything that is heavier than 10 lb (4.5 kg), or the limit that you are told, until your health care provider says that it is safe. Lifting a lot of weight can put added pressure on your feet. Lifestyle Wear comfortable, supportive shoes that fit you well. Do not wear Kimmer heels. Keep your feet clean and dry. General instructions Take over-the-counter and prescription medicines only as told by your health care provider. Rub your foot gently. Pay attention to any changes in your symptoms. Keep all follow-up visits as told by your health care provider. This is important. Contact a health care provider if: Your pain does not get better after a few days of self-care. Your pain gets worse. You cannot stand on your foot. Get help right away if: Your foot is numb or tingling. Your foot or toes are swollen. Your foot or toes turn white or blue. You have warmth and redness along your foot. Summary Common causes of foot pain are injury, sprain, arthritis, blisters or bunions. Ice, medicines, and comfortable shoes may help foot pain. Contact your health care provider if your pain does not get better after a few days of self-care. This information is not intended to replace advice given to you by your health care provider. Make sure you discuss any questions you have with your healthcare provider. Document Revised: 02/13/2018 Document Reviewed: 02/13/2018 Elsevier Patient Education  [place].

## 2020-10-24 NOTE — Progress Notes (Signed)
[address] [place], [place]  [address] Phone:  [phone]   Fax:  [phone]   Established Patient Office Visit  Subjective:  Patient ID: Michelle Villarreal, female    DOB: 12-22-1945  Age: 75 y.o. MRN: [id number]  CC:  Chief Complaint  Patient presents with   Follow-up    No questions or concern.    HPI Michelle Villarreal presents for follow up. She  has a past medical history of Adenoma, Bladder incontinence, Cataracts, bilateral, Chronic pain, Diabetes ([place]), Fall, Fibromyalgia, Hypertension, Neuropathy, Osteoarthritis, and Tinnitus.   Hypertension Patient is here for follow-up of elevated blood pressure. She is not exercising unable due to knee pain and is adherent to a low-salt diet. Blood pressure is well controlled at home. Cardiac symptoms: none. Patient denies chest pain, dyspnea, fatigue, and palpitations. Cardiovascular risk factors: advanced age (older than 35 for men, 51 for women), dyslipidemia, hypertension, obesity (BMI >= 30 kg/m2), and sedentary lifestyle. Use of agents associated with hypertension: NSAIDS. History of target organ damage: none   She was stared in [place] for her cholesterol and she reports on side effects and she has increased sensitivity with many medications.   She has ongoing arthritis pain in her hands and feet. She uses OTC medication this as been effective in the past however there declined in the efficacy over the last few weeks.   She is concern that her left left varicose veins are getting worse and causing her more distress. She can not afford surgery but would like some additional treatment options.   She completed her AWV and is concern of the overall goal. She reports that she is not depressed but angry with her current state after working her life 1-3 jobs to maintain and have to end up in  600 square foot room in a senior center. She also has a difficult relationship with her only daughter. She wants more for  her autist grandson.    Past Medical History:  Diagnosis Date   Adenoma    adrenal gland   Bladder incontinence    Cataracts, bilateral    Chronic pain    Diabetes ([place])    Fall    09/30/13, 03/15/15, 06/14/15   Fibromyalgia    Hypertension    Neuropathy    Osteoarthritis    Tinnitus     Past Surgical History:  Procedure Laterality Date   BREAST REDUCTION SURGERY     BUNIONECTOMY     REDUCTION MAMMAPLASTY Bilateral    REPLACEMENT TOTAL KNEE Bilateral 2003/2004   TUBAL LIGATION     VAGINAL HYSTERECTOMY      Family History  Problem Relation Age of Onset   Ovarian cancer Mother    Diabetes Sister    Arthritis Brother    Stroke Neg Hx    Migraines Neg Hx    Ataxia Neg Hx     Social History   Socioeconomic History   Marital status: Single    Spouse name: Not on file   Number of children: 1   Years of education: Not on file   Highest education level: Not on file  Occupational History   Not on file  Tobacco Use   Smoking status: Former    Years: 5.00    Pack years: 0.00    Types: Cigarettes   Smokeless tobacco: Never  Substance and Sexual Activity   Alcohol use: Yes   Drug use: No   Sexual activity: Never  Other Topics Concern   Not on file  Social History Narrative   Lives alone   Caffeine use: minimal    Social Determinants of Health   Financial Resource Strain: Low Risk    Difficulty of Paying Living Expenses: Not hard at all  Food Insecurity: No Food Insecurity   Worried About Charity fundraiser in the Last Year: Never true   Arboriculturist in the Last Year: Never true  Transportation Needs: No Transportation Needs   Lack of Transportation (Medical): No   Lack of Transportation (Non-Medical): No  Physical Activity: Insufficiently Active   Days of Exercise per Week: 1 day   Minutes of Exercise per Session: 30 min  Stress: Stress Concern Present   Feeling of Stress : Very much  Social Connections: Unknown   Frequency of Communication with  Friends and Family: More than three times a week   Frequency of Social Gatherings with Friends and Family: More than three times a week   Attends Religious Services: 1 to 4 times per year   Active Member of [employer] or Organizations: No   Attends Music therapist: Never   Marital Status: Patient refused  Human resources officer Violence: Not At Risk   Fear of Current or Ex-Partner: No   Emotionally Abused: No   Physically Abused: No   Sexually Abused: No    Outpatient Medications Prior to Visit  Medication Sig Dispense Refill   aspirin 81 MG chewable tablet Chew by mouth as needed.     Biotin 1000 MCG CHEW Chew by mouth.     Camphor-Menthol-Methyl Sal (SALONPAS EX) Apply topically.     Chlorpheniramine Maleate (ALLERGY PO) Take by mouth.     Cholecalciferol (VITAMIN D3 PO) Take 800 Units by mouth 3 (three) times a week.     ezetimibe (ZETIA) 10 MG tablet Take 1 tablet (10 mg total) by mouth daily. 30 tablet 11   Ginkgo Biloba 40 MG TABS Take by mouth.     guaiFENesin (MUCINEX) 600 MG 12 hr tablet Take by mouth 2 (two) times daily.     Liniments (SALONPAS PAIN RELIEF PATCH EX) Apply topically daily as needed.     Methen-Hyosc-Meth Blue-Na Phos (UROGESIC-BLUE PO) Take 1 tablet by mouth 4 (four) times daily as needed.     Methenamine-Sodium Salicylate (CYSTEX PO) Take by mouth.     naproxen sodium (ANAPROX) 220 MG tablet Take 220 mg by mouth every 8 (eight) hours as needed. AS NEEDED     olmesartan-hydrochlorothiazide (BENICAR HCT) 20-12.5 MG tablet Take 1 tablet by mouth daily.     vitamin E 400 UNIT capsule Take 400-800 Units by mouth. 3-4 times weekly per pt     No facility-administered medications prior to visit.    Allergies  Allergen Reactions   Ultram [Tramadol] Shortness Of Breath   Acetazolamide     Other reaction(s): severe headaches   Benzodiazepines Other (See Comments)    Short term memory loss   Codeine Itching    Can take with benadryl in certain time length    Demerol [Meperidine] Nausea Only   Duloxetine Hcl     Other reaction(s): memory loss short term   Fentanyl     Other reaction(s): itching   Fondaparinux Other (See Comments)    Increased bleeding Other reaction(s): increase bleeding potential   Gabapentin Other (See Comments)    Memory loss   Lyrica [Pregabalin] Other (See Comments)    Memory loss   Morphine And  Related Hives   Oxycodone-Acetaminophen     Other reaction(s): itching   Pentazocine     Other reaction(s): Itching   Zoloft [Sertraline Hcl] Other (See Comments)    Memory loss    ROS Review of Systems  HENT:  Positive for dental problem (tooth).   Respiratory:         Increased phelgm with  Noted with drinking water Waking a night having to cough This has been going fro a  She is having to clear her throat Clear phelgm   Musculoskeletal:  Positive for arthralgias (right foot pain increased sharp pain that radiates to up to heart).       Vaicose vein getting worse  Neurological:        Neuropathy is getting worse in her finger and toes     Objective:    Physical Exam Constitutional:      Appearance: She is obese.  HENT:     Head: Normocephalic and atraumatic.     Nose: Nose normal.     Mouth/Throat:     Mouth: Mucous membranes are moist.  Eyes:     Comments: glasses  Cardiovascular:     Rate and Rhythm: Normal rate and regular rhythm.     Pulses: Normal pulses.     Heart sounds: Normal heart sounds.  Pulmonary:     Effort: Pulmonary effort is normal.     Breath sounds: Normal breath sounds.  Abdominal:     Palpations: Abdomen is soft.  Musculoskeletal:        General: Normal range of motion.     Cervical back: Normal range of motion.     Comments: Varicose veins L>R   Skin:    General: Skin is warm.     Capillary Refill: Capillary refill takes less than 2 seconds.  Neurological:     General: No focal deficit present.     Mental Status: She is alert and oriented to person, place, and time.   Psychiatric:     Comments: Depression and angry    BP 122/75 (BP Location: Left Arm, Patient Position: Sitting)   Pulse 82   Temp 98.2 F (36.8 C)   Ht 5\' 10"  (1.778 m)   Wt 238 lb 0.2 oz (108 kg)   SpO2 98%   BMI 34.15 kg/m  Wt Readings from Last 3 Encounters:  10/24/20 238 lb 0.2 oz (108 kg)  10/21/20 241 lb (109.3 kg)  07/20/20 239 lb (108.4 kg)     Health Maintenance Due  Topic Date Due   OPHTHALMOLOGY EXAM  10/12/2020    There are no preventive care reminders to display for this patient.  Labs completed by previous provider within the last 6 month. Scanned in the EMR. Stable  Declined additional labs at this time.     Assessment & Plan:   Problem List Items Addressed This Visit       Cardiovascular and Mediastinum   Varicose veins of left lower extremity with other complications Ongoing desires treatment options   Relevant Medications   aspirin 81 MG chewable tablet   olmesartan-hydrochlorothiazide (BENICAR HCT) 20-12.5 MG tablet   Other Relevant Orders   Ambulatory referral to Vascular Surgery   Other Visit Diagnoses     Hammer toes of both feet    -  Primary Worsening  Requested referral for additional treatment options    Relevant Orders   Ambulatory referral to Podiatry   Right foot pain       Relevant  Orders   Ambulatory referral to Podiatry   History of environmental allergies      Pure hypercholesteremia Stable will continue with Zetia and follow up with labs in 6 months Hypertension Stable on current regimen Encouraged on going compliance with current medication regimen Encouraged home monitoring and recording BP <130/80 Eating a heart-healthy diet with less salt Encouraged regular physical activity  Recommend Weight loss    No orders of the defined types were placed in this encounter.   Follow-up: Return in about 6 months (around 04/25/2021) for Follow up HTN [id number].    Vevelyn Francois, NP

## 2020-10-31 ENCOUNTER — Ambulatory Visit (INDEPENDENT_AMBULATORY_CARE_PROVIDER_SITE_OTHER): Payer: Medicare HMO

## 2020-10-31 ENCOUNTER — Encounter: Payer: Self-pay | Admitting: Podiatry

## 2020-10-31 ENCOUNTER — Ambulatory Visit: Payer: Medicare HMO | Admitting: Podiatry

## 2020-10-31 ENCOUNTER — Other Ambulatory Visit: Payer: Self-pay

## 2020-10-31 DIAGNOSIS — M2041 Other hammer toe(s) (acquired), right foot: Secondary | ICD-10-CM | POA: Diagnosis not present

## 2020-10-31 DIAGNOSIS — M2042 Other hammer toe(s) (acquired), left foot: Secondary | ICD-10-CM | POA: Diagnosis not present

## 2020-10-31 DIAGNOSIS — M19079 Primary osteoarthritis, unspecified ankle and foot: Secondary | ICD-10-CM | POA: Diagnosis not present

## 2020-10-31 DIAGNOSIS — M7751 Other enthesopathy of right foot: Secondary | ICD-10-CM | POA: Diagnosis not present

## 2020-10-31 DIAGNOSIS — Q66222 Congenital metatarsus adductus, left foot: Secondary | ICD-10-CM | POA: Diagnosis not present

## 2020-10-31 DIAGNOSIS — Q66221 Congenital metatarsus adductus, right foot: Secondary | ICD-10-CM

## 2020-11-01 NOTE — Progress Notes (Signed)
  Subjective:  Patient ID: Michelle Villarreal, female    DOB: January 23, 1946,  MRN: [id number]  Chief Complaint  Patient presents with   Cyst    PT states she has two knots on the lateral side of her feet.     75 y.o. female presents with the above complaint. History confirmed with patient.  Painful when walking.  She describes as a knot.  She also has neuropathy.  She has taken Aleve and this is helped some.  Objective:  Physical Exam: warm, good capillary refill, no trophic changes or ulcerative lesions, normal DP and PT pulses, and normal sensory exam.  She has prominent fifth metatarsal bases bilaterally.  Edema and tenderness over the plantar fifth metatarsal base on the right side  Radiographs: Multiple views x-ray of both feet: no fracture, dislocation, swelling or degenerative changes noted she has metatarsus adductus deformity bilateral with right worse than left, increased left tissue density over the fifth metatarsal base Assessment:   1. Bursitis of right foot   2. Hammertoes of both feet   3. Metatarsus adductus of both feet   4. Osteoarthritis of ankle and foot, unspecified laterality      Plan:  Patient was evaluated and treated and all questions answered.  Reviewed the radiographs and clinical findings with the patient.  We discussed treatment with supportive shoe gear, orthotics and how her metatarsal adductus deformity contributes to the pain she is having over the area.  I think she likely has bursitis.  Was very tender and red to touch today.  No signs of cellulitis, no wounds or history of punctures that could be related to an infection.  Recommended a corticosteroid injection into the adventitial bursa.  After sterile prep with alcohol 2 mg of dexamethasone 5 mg of Kenalog and 1 mL of lidocaine 2% plain was injected into the bursa using ethyl chloride spray.  She tolerated well it was dressed with a Band-Aid.  Return as needed.  Return if symptoms worsen or fail to improve.

## 2020-12-01 ENCOUNTER — Other Ambulatory Visit: Payer: Self-pay

## 2020-12-01 ENCOUNTER — Ambulatory Visit (INDEPENDENT_AMBULATORY_CARE_PROVIDER_SITE_OTHER): Payer: Medicare HMO | Admitting: Otolaryngology

## 2020-12-01 DIAGNOSIS — J309 Allergic rhinitis, unspecified: Secondary | ICD-10-CM

## 2020-12-01 NOTE — Progress Notes (Signed)
HPI: Michelle Villarreal is a 75 y.o. female who presents is referred by her PCP for evaluation of allergy complaints.  She is living in a senior home presently.  She complains mostly of throat clearing and a lot of excessive mucus when she eats.  She has mild nasal congestion.  No sore throat.  No hoarseness.  She is really never been tested for allergies but has different allergy responses to environmental allergies and grass.  She has tried several different antihistamines.  She has also used Mucinex when she was having a cough which seemed to help..  Past Medical History:  Diagnosis Date   Adenoma    adrenal gland   Bladder incontinence    Cataracts, bilateral    Chronic pain    Diabetes ([place])    Fall    09/30/13, 03/15/15, 06/14/15   Fibromyalgia    Hypertension    Neuropathy    Osteoarthritis    Tinnitus    Past Surgical History:  Procedure Laterality Date   BREAST REDUCTION SURGERY     BUNIONECTOMY     REDUCTION MAMMAPLASTY Bilateral    REPLACEMENT TOTAL KNEE Bilateral 2003/2004   TUBAL LIGATION     VAGINAL HYSTERECTOMY     Social History   Socioeconomic History   Marital status: Single    Spouse name: Not on file   Number of children: 1   Years of education: Not on file   Highest education level: Not on file  Occupational History   Not on file  Tobacco Use   Smoking status: Former    Years: 5.00    Types: Cigarettes   Smokeless tobacco: Never  Substance and Sexual Activity   Alcohol use: Yes   Drug use: No   Sexual activity: Never  Other Topics Concern   Not on file  Social History Narrative   Lives alone   Caffeine use: minimal    Social Determinants of Health   Financial Resource Strain: Low Risk    Difficulty of Paying Living Expenses: Not hard at all  Food Insecurity: No Food Insecurity   Worried About Charity fundraiser in the Last Year: Never true   Ran Out of Food in the Last Year: Never true  Transportation Needs: No Transportation Needs   Lack  of Transportation (Medical): No   Lack of Transportation (Non-Medical): No  Physical Activity: Insufficiently Active   Days of Exercise per Week: 1 day   Minutes of Exercise per Session: 30 min  Stress: Stress Concern Present   Feeling of Stress : Very much  Social Connections: Unknown   Frequency of Communication with Friends and Family: More than three times a week   Frequency of Social Gatherings with Friends and Family: More than three times a week   Attends Religious Services: 1 to 4 times per year   Active Member of [employer] or Organizations: No   Attends Music therapist: Never   Marital Status: Patient refused   Family History  Problem Relation Age of Onset   Ovarian cancer Mother    Diabetes Sister    Arthritis Brother    Stroke Neg Hx    Migraines Neg Hx    Ataxia Neg Hx    Allergies  Allergen Reactions   Ultram [Tramadol] Shortness Of Breath   Acetazolamide     Other reaction(s): severe headaches   Benzodiazepines Other (See Comments)    Short term memory loss   Codeine Itching    Can  take with benadryl in certain time length   Demerol [Meperidine] Nausea Only   Duloxetine Hcl     Other reaction(s): memory loss short term   Fentanyl     Other reaction(s): itching   Fondaparinux Other (See Comments)    Increased bleeding Other reaction(s): increase bleeding potential   Gabapentin Other (See Comments)    Memory loss   Lyrica [Pregabalin] Other (See Comments)    Memory loss   Morphine And Related Hives   Oxycodone-Acetaminophen     Other reaction(s): itching   Pentazocine     Other reaction(s): Itching   Zoloft [Sertraline Hcl] Other (See Comments)    Memory loss   Prior to Admission medications   Medication Sig Start Date End Date Taking? Authorizing Provider  aspirin 81 MG chewable tablet Chew by mouth as needed.    [provider]  Biotin 1000 MCG CHEW Chew by mouth.    [provider]  Camphor-Menthol-Methyl Sal  (SALONPAS EX) Apply topically.    [provider]  Chlorpheniramine Maleate (ALLERGY PO) Take by mouth.    [provider]  Cholecalciferol (VITAMIN D3 PO) Take 800 Units by mouth 3 (three) times a week.    [provider]  ezetimibe (ZETIA) 10 MG tablet Take 1 tablet (10 mg total) by mouth daily. 07/20/20 07/20/21  Vevelyn Francois, NP  Ginkgo Biloba 40 MG TABS Take by mouth.    [provider]  guaiFENesin (MUCINEX) 600 MG 12 hr tablet Take by mouth 2 (two) times daily.    [provider]  Liniments (SALONPAS PAIN RELIEF PATCH EX) Apply topically daily as needed.    [provider]  Methen-Hyosc-Meth Blue-Na Phos (UROGESIC-BLUE PO) Take 1 tablet by mouth 4 (four) times daily as needed.    [provider]  Methenamine-Sodium Salicylate (CYSTEX PO) Take by mouth.    [provider]  naproxen sodium (ANAPROX) 220 MG tablet Take 220 mg by mouth every 8 (eight) hours as needed. AS NEEDED    [provider]  olmesartan-hydrochlorothiazide (BENICAR HCT) 20-12.5 MG tablet Take 1 tablet by mouth daily.    [provider]  vitamin E 400 UNIT capsule Take 400-800 Units by mouth. 3-4 times weekly per pt    [provider]     Positive ROS: Otherwise negative  All other systems have been reviewed and were otherwise negative with the exception of those mentioned in the HPI and as above.  Physical Exam: Constitutional: Alert, well-appearing, no acute distress Ears: External ears without lesions or tenderness. Ear canals are clear bilaterally with intact, clear TMs bilaterally Nasal: External nose without lesions. Septum with minimal deformity and moderate rhinitis.  After decongesting the nose both middle meatus regions were clear with no signs of infection.  There were no polyps in the nasal cavity..  Oral: Lips and gums without lesions. Tongue and palate mucosa without lesions. Posterior oropharynx clear.   Tonsil regions appear benign bilaterally.  Indirect laryngoscopy revealed a clear base of tongue vallecula and epiglottis.  Vocal cords were clear bilaterally with normal vocal mobility.  She had minimal edema of the arytenoid mucosal with clear piriform sinuses bilaterally. Neck: No palpable adenopathy or masses Respiratory: Breathing comfortably  Skin: No facial/neck lesions or rash noted.  Procedures  Assessment: Allergic rhinitis  Plan: Discussed with her concerning treatment of allergies which would consist of antihistamine as well as nasal steroid spray and suggested using Flonase 2 sprays each nostril at night on a  regular basis.  She has used this previously but only intermittently. She asked me about eyedrop for allergies and suggested trying [place]. She might benefit by seeing an allergist to get any allergy testing and recommendations per allergist.   Radene Journey, MD   CC:

## 2021-01-03 ENCOUNTER — Encounter (HOSPITAL_COMMUNITY): Payer: Medicare HMO

## 2021-01-03 ENCOUNTER — Encounter: Payer: Medicare HMO | Admitting: Vascular Surgery

## 2021-02-02 ENCOUNTER — Encounter (HOSPITAL_COMMUNITY): Payer: Self-pay | Admitting: *Deleted

## 2021-02-02 ENCOUNTER — Other Ambulatory Visit: Payer: Self-pay

## 2021-02-02 ENCOUNTER — Ambulatory Visit (HOSPITAL_COMMUNITY)
Admission: EM | Admit: 2021-02-02 | Discharge: 2021-02-02 | Disposition: A | Discharge status: Home / Self Care | Payer: Medicare HMO | Attending: Internal Medicine | Admitting: Internal Medicine

## 2021-02-02 ENCOUNTER — Ambulatory Visit (INDEPENDENT_AMBULATORY_CARE_PROVIDER_SITE_OTHER): Payer: Medicare HMO

## 2021-02-02 DIAGNOSIS — R059 Cough, unspecified: Secondary | ICD-10-CM | POA: Diagnosis not present

## 2021-02-02 DIAGNOSIS — J209 Acute bronchitis, unspecified: Secondary | ICD-10-CM | POA: Diagnosis not present

## 2021-02-02 MED ORDER — PREDNISONE 20 MG PO TABS: 20.0000 mg | ORAL_TABLET | Freq: Every day | ORAL | 0 refills | Status: AC

## 2021-02-02 MED ORDER — ALBUTEROL SULFATE HFA 108 (90 BASE) MCG/ACT IN AERS: 2.0000 | INHALATION_SPRAY | Freq: Four times a day (QID) | RESPIRATORY_TRACT | 0 refills | Status: DC | PRN

## 2021-02-02 MED ORDER — BENZONATATE 100 MG PO CAPS: 100.0000 mg | ORAL_CAPSULE | Freq: Three times a day (TID) | ORAL | 0 refills | Status: DC

## 2021-02-02 NOTE — Discharge Instructions (Addendum)
Your chest X-ray is negative for pneumonia Please take your medication as prescribed Please return to urgent care if symptoms worsen.

## 2021-02-02 NOTE — ED Triage Notes (Signed)
Pt reports cough ,HA and sore throat.

## 2021-02-03 NOTE — ED Provider Notes (Signed)
[name]    CSN: [id number] Arrival date & time: 02/02/21  1317      History   Chief Complaint Chief Complaint  Patient presents with   Cough   Headache    HPI Michelle Villarreal is a 75 y.o. female comes to the urgent care with 1 week history of cough productive of clear sputum.  Patient's symptoms started a week ago with cough productive of clear sputum.  She had sore throat and headache at the outset.  Headache and sore throat has subsided.  No nausea, vomiting or diarrhea.  Patient endorses intermittent wheezing and shortness of breath but denies any chest pain or chest pressure.  No chest tightness.  No seasonal allergies.  No toxic exposures. HPI  Past Medical History:  Diagnosis Date   Adenoma    adrenal gland   Bladder incontinence    Cataracts, bilateral    Chronic pain    Diabetes ([place])    Fall    09/30/13, 03/15/15, 06/14/15   Fibromyalgia    Hypertension    Neuropathy    Osteoarthritis    Tinnitus     Patient Active Problem List   Diagnosis Date Noted   Age-related osteoporosis without current pathological fracture 07/19/2020   Anxiety disorder 07/19/2020   Hardening of the aorta (main artery of the heart) ([place]) 07/19/2020   Major depression single episode, in partial remission ([employer]) 07/19/2020   Morbid obesity ([place]) 07/19/2020   Obstructive sleep apnea syndrome 07/19/2020   Personal history of colonic polyps 07/19/2020   Pure hypercholesterolemia 07/19/2020   Tinnitus 07/19/2020   Sleep disorder 07/19/2020   Varicose veins of left lower extremity with other complications 62/37/6283   Subjective memory complaints 02/17/2018   Mixed stress and urge urinary incontinence 12/24/2017   Pulsatile tinnitus of right ear 07/27/2015   Essential hypertension 07/27/2015   Hereditary and idiopathic peripheral neuropathy 07/27/2015   Fibromyalgia 07/27/2015    Past Surgical History:  Procedure Laterality Date   BREAST REDUCTION SURGERY      BUNIONECTOMY     REDUCTION MAMMAPLASTY Bilateral    REPLACEMENT TOTAL KNEE Bilateral 2003/2004   TUBAL LIGATION     VAGINAL HYSTERECTOMY      OB History   No obstetric history on file.      Home Medications    Prior to Admission medications   Medication Sig Start Date End Date Taking? Authorizing Provider  albuterol (VENTOLIN HFA) 108 (90 Base) MCG/ACT inhaler Inhale 2 puffs into the lungs every 6 (six) hours as needed for wheezing or shortness of breath. 02/02/21  Yes Zeferino Mounts, Myrene Galas, MD  benzonatate (TESSALON) 100 MG capsule Take 1 capsule (100 mg total) by mouth every 8 (eight) hours. 02/02/21  Yes Teyona Nichelson, Myrene Galas, MD  predniSONE (DELTASONE) 20 MG tablet Take 1 tablet (20 mg total) by mouth daily for 5 days. 02/02/21 02/07/21 Yes Danee Soller, Myrene Galas, MD  aspirin 81 MG chewable tablet Chew by mouth as needed.    [provider]  Biotin 1000 MCG CHEW Chew by mouth.    [provider]  Camphor-Menthol-Methyl Sal (SALONPAS EX) Apply topically.    [provider]  Chlorpheniramine Maleate (ALLERGY PO) Take by mouth.    [provider]  Cholecalciferol (VITAMIN D3 PO) Take 800 Units by mouth 3 (three) times a week.    [provider]  ezetimibe (ZETIA) 10 MG tablet Take 1 tablet (10 mg total) by mouth daily. 07/20/20 07/20/21  Vevelyn Francois,  NP  Ginkgo Biloba 40 MG TABS Take by mouth.    [provider]  guaiFENesin (MUCINEX) 600 MG 12 hr tablet Take by mouth 2 (two) times daily.    [provider]  Liniments (SALONPAS PAIN RELIEF PATCH EX) Apply topically daily as needed.    [provider]  Methen-Hyosc-Meth Blue-Na Phos (UROGESIC-BLUE PO) Take 1 tablet by mouth 4 (four) times daily as needed.    [provider]  Methenamine-Sodium Salicylate (CYSTEX PO) Take by mouth.    [provider]  naproxen sodium (ANAPROX) 220 MG tablet Take 220 mg by mouth every 8 (eight) hours as needed. AS NEEDED     [provider]  olmesartan-hydrochlorothiazide (BENICAR HCT) 20-12.5 MG tablet Take 1 tablet by mouth daily.    [provider]  vitamin E 400 UNIT capsule Take 400-800 Units by mouth. 3-4 times weekly per pt    [provider]    Family History Family History  Problem Relation Age of Onset   Ovarian cancer Mother    Diabetes Sister    Arthritis Brother    Stroke Neg Hx    Migraines Neg Hx    Ataxia Neg Hx     Social History Social History   Tobacco Use   Smoking status: Former    Years: 5.00    Types: Cigarettes   Smokeless tobacco: Never  Substance Use Topics   Alcohol use: Yes   Drug use: No     Allergies   Ultram [tramadol], Acetazolamide, Benzodiazepines, Codeine, Demerol [meperidine], Duloxetine hcl, Fentanyl, Fondaparinux, Gabapentin, Lyrica [pregabalin], Morphine and related, Oxycodone-acetaminophen, Pentazocine, and Zoloft [sertraline hcl]   Review of Systems Review of Systems  HENT:  Positive for sore throat. Negative for congestion, sinus pain and sneezing.   Respiratory:  Positive for cough, shortness of breath and wheezing.   Cardiovascular: Negative.   Gastrointestinal: Negative.   Neurological:  Positive for headaches.    Physical Exam Triage Vital Signs ED Triage Vitals  Enc Vitals Group     BP 02/02/21 1517 (!) 158/84     Pulse Rate 02/02/21 1517 91     Resp 02/02/21 1517 20     Temp 02/02/21 1517 98.2 F (36.8 C)     Temp src --      SpO2 02/02/21 1517 98 %     Weight --      Height --      Head Circumference --      Peak Flow --      Pain Score 02/02/21 1518 10     Pain Loc --      Pain Edu? --      Excl. in [place]? --    No data found.  Updated Vital Signs BP (!) 158/84   Pulse 91   Temp 98.2 F (36.8 C)   Resp 20   SpO2 98%   Visual Acuity Right Eye Distance:   Left Eye Distance:   Bilateral Distance:    Right Eye Near:   Left Eye Near:    Bilateral Near:     Physical Exam Vitals and  nursing note reviewed.  Constitutional:      Appearance: She is well-developed.  Cardiovascular:     Rate and Rhythm: Normal rate and regular rhythm.  Pulmonary:     Effort: Pulmonary effort is normal.     Breath sounds: No stridor. Wheezing and rhonchi present.  Abdominal:     General: Bowel sounds are normal.  Palpations: Abdomen is soft.  Skin:    General: Skin is warm.  Neurological:     Mental Status: She is alert and oriented to person, place, and time.     UC Treatments / Results  Labs (all labs ordered are listed, but only abnormal results are displayed) Labs Reviewed - No data to display  EKG   Radiology DG Chest 2 View  Result Date: 02/02/2021 CLINICAL DATA:  Cough EXAM: CHEST - 2 VIEW COMPARISON:  12/03/2019 chest radiograph. FINDINGS: Stable cardiomediastinal silhouette with normal heart size. No pneumothorax. No pleural effusion. Lungs appear clear, with no acute consolidative airspace disease and no pulmonary edema. IMPRESSION: No active cardiopulmonary disease. Electronically Signed   By: Ilona Sorrel M.D.   On: 02/02/2021 16:06    Procedures Procedures (including critical care time)  Medications Ordered in UC Medications - No data to display  Initial Impression / Assessment and Plan / UC Course  I have reviewed the triage vital signs and the nursing notes.  Pertinent labs & imaging results that were available during my care of the patient were reviewed by me and considered in my medical decision making (see chart for details).     1.  Acute bronchitis with bronchospasm: Chest x-ray is negative for acute lung infiltrate Albuterol inhaler Prednisone 20 mg orally daily for 5 days Tessalon Perles as needed for cough Return precautions given. Final Clinical Impressions(s) / UC Diagnoses   Final diagnoses:  Acute bronchitis with bronchospasm     Discharge Instructions      Your chest X-ray is negative for pneumonia Please take your medication  as prescribed Please return to urgent care if symptoms worsen.   ED Prescriptions     Medication Sig Dispense Auth. Provider   albuterol (VENTOLIN HFA) 108 (90 Base) MCG/ACT inhaler Inhale 2 puffs into the lungs every 6 (six) hours as needed for wheezing or shortness of breath. 18 g Saharah Sherrow, Myrene Galas, MD   predniSONE (DELTASONE) 20 MG tablet Take 1 tablet (20 mg total) by mouth daily for 5 days. 5 tablet Ringo Sherod, Myrene Galas, MD   benzonatate (TESSALON) 100 MG capsule Take 1 capsule (100 mg total) by mouth every 8 (eight) hours. 30 capsule Merriam Brandner, Myrene Galas, MD      PDMP not reviewed this encounter.   Chase Picket, MD 02/03/21 [phone]

## 2021-03-14 DIAGNOSIS — N644 Mastodynia: Secondary | ICD-10-CM | POA: Diagnosis not present

## 2021-03-14 DIAGNOSIS — M8580 Other specified disorders of bone density and structure, unspecified site: Secondary | ICD-10-CM | POA: Diagnosis not present

## 2021-03-14 DIAGNOSIS — F32A Depression, unspecified: Secondary | ICD-10-CM | POA: Diagnosis not present

## 2021-03-14 DIAGNOSIS — R6889 Other general symptoms and signs: Secondary | ICD-10-CM | POA: Diagnosis not present

## 2021-03-14 DIAGNOSIS — Z01411 Encounter for gynecological examination (general) (routine) with abnormal findings: Secondary | ICD-10-CM | POA: Diagnosis not present

## 2021-03-14 DIAGNOSIS — Z13820 Encounter for screening for osteoporosis: Secondary | ICD-10-CM | POA: Diagnosis not present

## 2021-03-14 DIAGNOSIS — N393 Stress incontinence (female) (male): Secondary | ICD-10-CM | POA: Diagnosis not present

## 2021-03-21 ENCOUNTER — Other Ambulatory Visit: Payer: Self-pay | Admitting: Obstetrics and Gynecology

## 2021-03-21 DIAGNOSIS — N644 Mastodynia: Secondary | ICD-10-CM

## 2021-04-26 ENCOUNTER — Ambulatory Visit (INDEPENDENT_AMBULATORY_CARE_PROVIDER_SITE_OTHER): Payer: Medicare HMO | Admitting: Nurse Practitioner

## 2021-04-26 ENCOUNTER — Other Ambulatory Visit: Payer: Self-pay

## 2021-04-26 ENCOUNTER — Encounter: Payer: Self-pay | Admitting: Nurse Practitioner

## 2021-04-26 VITALS — BP 143/83 | HR 94 | Temp 97.6°F | Ht 70.0 in | Wt 234.0 lb

## 2021-04-26 DIAGNOSIS — R239 Unspecified skin changes: Secondary | ICD-10-CM | POA: Diagnosis not present

## 2021-04-26 DIAGNOSIS — F341 Dysthymic disorder: Secondary | ICD-10-CM

## 2021-04-26 DIAGNOSIS — E7800 Pure hypercholesterolemia, unspecified: Secondary | ICD-10-CM | POA: Diagnosis not present

## 2021-04-26 DIAGNOSIS — I10 Essential (primary) hypertension: Secondary | ICD-10-CM | POA: Diagnosis not present

## 2021-04-26 DIAGNOSIS — F324 Major depressive disorder, single episode, in partial remission: Secondary | ICD-10-CM | POA: Diagnosis not present

## 2021-04-26 NOTE — Progress Notes (Signed)
[name], [place]  [address] Phone:  [phone]   Fax:  [phone]   Established Patient Office Visit  Subjective:  Patient ID: Michelle Villarreal, female    DOB: May 04, 1946  Age: 75 y.o. MRN: [id number]  CC:  Chief Complaint  Patient presents with   Follow-up    Pt is here today for her 6 month follow up visit. Pt states that she canceled her appt with the vascular surgeon due to she feels she only need the whole leg compression stockings.      HPI Mystery O Fortin presents for follow up. She  has a past medical history of Adenoma, Bladder incontinence, Cataracts, bilateral, Chronic pain, Diabetes ([place]), Fall, Fibromyalgia, Hypertension, Neuropathy, Osteoarthritis, and Tinnitus.   She is no longer taking Zetia. She reports it made her feel bad.   She was started on Wellbutrin. She is not taking this due to it made her feel lethargic.   She is upset about working all the years and not about not being able to live in on her own. This make her depressed. She is unable to afford things and just makes a little too much for government assistance. She is not able to be seen by the opthalmologic or dentist due to insurance barriers/cost. This is all due to her having to come out of work on disability.   She was seen by podiatry. She was treated but is not able to afford the orthotics needed for her feet.   She needs a "happy pill." She admits to drinking a fifth of liquor most days to help her cope.  Past Medical History:  Diagnosis Date   Adenoma    adrenal gland   Bladder incontinence    Cataracts, bilateral    Chronic pain    Diabetes ([place])    Fall    09/30/13, 03/15/15, 06/14/15   Fibromyalgia    Hypertension    Neuropathy    Osteoarthritis    Tinnitus     Past Surgical History:  Procedure Laterality Date   BREAST REDUCTION SURGERY     BUNIONECTOMY     REDUCTION MAMMAPLASTY Bilateral    REPLACEMENT TOTAL KNEE Bilateral  2003/2004   TUBAL LIGATION     VAGINAL HYSTERECTOMY      Family History  Problem Relation Age of Onset   Ovarian cancer Mother    Diabetes Sister    Arthritis Brother    Stroke Neg Hx    Migraines Neg Hx    Ataxia Neg Hx     Social History   Socioeconomic History   Marital status: Single    Spouse name: Not on file   Number of children: 1   Years of education: Not on file   Highest education level: Not on file  Occupational History   Not on file  Tobacco Use   Smoking status: Former    Years: 5.00    Types: Cigarettes   Smokeless tobacco: Never  Vaping Use   Vaping Use: Never used  Substance and Sexual Activity   Alcohol use: Yes   Drug use: No   Sexual activity: Never  Other Topics Concern   Not on file  Social History Narrative   Lives alone   Caffeine use: minimal    Social Determinants of Health   Financial Resource Strain: Low Risk    Difficulty of Paying Living Expenses: Not hard at all  Food Insecurity: No Food Insecurity  Worried About Charity fundraiser in the Last Year: Never true   [place] in the Last Year: Never true  Transportation Needs: No Transportation Needs   Lack of Transportation (Medical): No   Lack of Transportation (Non-Medical): No  Physical Activity: Insufficiently Active   Days of Exercise per Week: 1 day   Minutes of Exercise per Session: 30 min  Stress: Stress Concern Present   Feeling of Stress : Very much  Social Connections: Unknown   Frequency of Communication with Friends and Family: More than three times a week   Frequency of Social Gatherings with Friends and Family: More than three times a week   Attends Religious Services: 1 to 4 times per year   Active Member of [employer] or Organizations: No   Attends Music therapist: Never   Marital Status: Patient refused  Human resources officer Violence: Not At Risk   Fear of Current or Ex-Partner: No   Emotionally Abused: No   Physically Abused: No   Sexually  Abused: No    Outpatient Medications Prior to Visit  Medication Sig Dispense Refill   aspirin 81 MG chewable tablet Chew by mouth as needed.     Biotin 1000 MCG CHEW Chew by mouth.     Camphor-Menthol-Methyl Sal (SALONPAS EX) Apply topically.     Chlorpheniramine Maleate (ALLERGY PO) Take by mouth.     Cholecalciferol (VITAMIN D3 PO) Take 800 Units by mouth 3 (three) times a week.     guaiFENesin (MUCINEX) 600 MG 12 hr tablet Take by mouth 2 (two) times daily.     Liniments (SALONPAS PAIN RELIEF PATCH EX) Apply topically daily as needed.     Methen-Hyosc-Meth Blue-Na Phos (UROGESIC-BLUE PO) Take 1 tablet by mouth 4 (four) times daily as needed.     naproxen sodium (ANAPROX) 220 MG tablet Take 220 mg by mouth every 8 (eight) hours as needed. AS NEEDED     olmesartan-hydrochlorothiazide (BENICAR HCT) 20-12.5 MG tablet Take 1 tablet by mouth daily.     vitamin E 400 UNIT capsule Take 400-800 Units by mouth. 3-4 times weekly per pt     albuterol (VENTOLIN HFA) 108 (90 Base) MCG/ACT inhaler Inhale 2 puffs into the lungs every 6 (six) hours as needed for wheezing or shortness of breath. 18 g 0   Ginkgo Biloba 40 MG TABS Take by mouth.     Methenamine-Sodium Salicylate (CYSTEX PO) Take by mouth.     tolterodine (DETROL) 1 MG tablet      benzonatate (TESSALON) 100 MG capsule Take 1 capsule (100 mg total) by mouth every 8 (eight) hours. (Patient not taking: Reported on 04/26/2021) 30 capsule 0   buPROPion (WELLBUTRIN XL) 150 MG 24 hr tablet  (Patient not taking: Reported on 04/26/2021)     ezetimibe (ZETIA) 10 MG tablet Take 1 tablet (10 mg total) by mouth daily. (Patient not taking: Reported on 04/26/2021) 30 tablet 11   No facility-administered medications prior to visit.    Allergies  Allergen Reactions   Ultram [Tramadol] Shortness Of Breath   Acetazolamide     Other reaction(s): severe headaches   Benzodiazepines Other (See Comments)    Short term memory loss   Codeine Itching    Can  take with benadryl in certain time length   Demerol [Meperidine] Nausea Only   Duloxetine Hcl     Other reaction(s): memory loss short term   Fentanyl     Other reaction(s): itching   Fondaparinux Other (  See Comments)    Increased bleeding Other reaction(s): increase bleeding potential   Gabapentin Other (See Comments)    Memory loss   Lyrica [Pregabalin] Other (See Comments)    Memory loss   Morphine And Related Hives   Oxycodone-Acetaminophen     Other reaction(s): itching   Pentazocine     Other reaction(s): Itching   Zoloft [Sertraline Hcl] Other (See Comments)    Memory loss    ROS Review of Systems  Skin:        Mole face Mole back     Objective:    Physical Exam Constitutional:      General: She is not in acute distress.    Appearance: She is obese. She is not ill-appearing, toxic-appearing or diaphoretic.  HENT:     Head: Normocephalic and atraumatic.     Nose: Nose normal.     Mouth/Throat:     Mouth: Mucous membranes are moist.  Cardiovascular:     Rate and Rhythm: Normal rate and regular rhythm.     Pulses: Normal pulses.     Heart sounds: Normal heart sounds.  Pulmonary:     Effort: Pulmonary effort is normal.     Breath sounds: Normal breath sounds.  Abdominal:     Palpations: Abdomen is soft.     Comments: hypoactive  Musculoskeletal:        General: Normal range of motion.     Cervical back: Normal range of motion.     Right lower leg: No edema.     Left lower leg: No edema.  Skin:    Capillary Refill: Capillary refill takes less than 2 seconds.     Comments: Asymmetric mole to right middle back  Neurological:     General: No focal deficit present.     Mental Status: She is alert and oriented to person, place, and time.  Psychiatric:        Thought Content: Thought content normal.    BP (!) 143/83    Pulse 94    Temp 97.6 F (36.4 C)    Ht 5\' 10"  (1.778 m)    Wt 234 lb (106.1 kg)    SpO2 100%    BMI 33.58 kg/m  Wt Readings from Last 3  Encounters:  04/26/21 234 lb (106.1 kg)  10/24/20 238 lb 0.2 oz (108 kg)  10/21/20 241 lb (109.3 kg)     There are no preventive care reminders to display for this patient.   There are no preventive care reminders to display for this patient.  Lab Results  Component Value Date   TSH 2.150 12/28/2015   Lab Results  Component Value Date   WBC 6.5 05/06/2017   HGB 13.8 05/06/2017   HCT 42.5 05/06/2017   MCV 95.3 05/06/2017   PLT 175 05/06/2017   Lab Results  Component Value Date   NA 141 05/06/2017   K 4.1 05/06/2017   CO2 29 05/06/2017   GLUCOSE 104 (H) 05/06/2017   BUN 12 05/06/2017   CREATININE 0.69 05/06/2017   CALCIUM 9.7 05/06/2017   ANIONGAP 7 05/06/2017   No results found for: CHOL No results found for: HDL No results found for: LDLCALC No results found for: TRIG No results found for: CHOLHDL No results found for: HGBA1C    Assessment & Plan:   Problem List Items Addressed This Visit       Cardiovascular and Mediastinum   Essential hypertension - Primary Encouraged on going compliance with current medication  regimen Encouraged home monitoring and recording BP <130/80 Eating a heart-healthy diet with less salt Encouraged regular physical activity  Recommend Weight loss     Relevant Orders   Comp. Metabolic Panel (12)     Other   Major depression single episode, in partial remission (HCC) Persistent unable to tolerate Wellbutrin does not like to take medications due to side effects   Pure hypercholesterolemia Evaluation pending   Relevant Orders   Lipid panel   Other Visit Diagnoses     Unspecified skin changes     Changes to facial mole and back mole Declined referral at this time due to change in insurance.    Persistent depressive disorder     Discussed Medicines that relieve depression Discussed counseling (with a psychiatrist, psychologist, or social worker)        No orders of the defined types were placed in this  encounter.   Follow-up: Return in about 6 months (around 10/25/2021) for needs transportaion assistance, Follow up HTN [id number].    Vevelyn Francois, NP

## 2021-04-27 LAB — COMP. METABOLIC PANEL (12)
AST: 27 IU/L (ref 0–40)
Albumin/Globulin Ratio: 2.1 (ref 1.2–2.2)
Albumin: 4.8 g/dL — ABNORMAL HIGH (ref 3.7–4.7)
Alkaline Phosphatase: 79 IU/L (ref 44–121)
BUN/Creatinine Ratio: 18 (ref 12–28)
BUN: 12 mg/dL (ref 8–27)
Bilirubin Total: 0.4 mg/dL (ref 0.0–1.2)
Calcium: 9.7 mg/dL (ref 8.7–10.3)
Chloride: 100 mmol/L (ref 96–106)
Creatinine, Ser: 0.68 mg/dL (ref 0.57–1.00)
Globulin, Total: 2.3 g/dL (ref 1.5–4.5)
Glucose: 90 mg/dL (ref 70–99)
Potassium: 3.5 mmol/L (ref 3.5–5.2)
Sodium: 140 mmol/L (ref 134–144)
Total Protein: 7.1 g/dL (ref 6.0–8.5)
eGFR: 91 mL/min/{1.73_m2} (ref >59)

## 2021-04-27 LAB — LIPID PANEL
Chol/HDL Ratio: 2.8 ratio (ref 0.0–4.4)
Cholesterol, Total: 220 mg/dL — ABNORMAL HIGH (ref 100–199)
HDL: 80 mg/dL (ref >39)
LDL Chol Calc (NIH): 131 mg/dL — ABNORMAL HIGH (ref 0–99)
Triglycerides: 50 mg/dL (ref 0–149)
VLDL Cholesterol Cal: 9 mg/dL (ref 5–40)

## 2021-05-17 ENCOUNTER — Ambulatory Visit: Payer: Medicare HMO | Admitting: Neurology

## 2021-07-25 ENCOUNTER — Telehealth: Payer: Self-pay

## 2021-07-25 ENCOUNTER — Other Ambulatory Visit: Payer: Self-pay | Admitting: Nurse Practitioner

## 2021-07-25 MED ORDER — OLMESARTAN MEDOXOMIL-HCTZ 20-12.5 MG PO TABS: 1.0000 | ORAL_TABLET | Freq: Every day | ORAL | 0 refills | Status: DC

## 2021-07-25 NOTE — Telephone Encounter (Signed)
Can you please call this pt she is frustrated and need's some help with a medication!!! ?

## 2021-07-27 NOTE — Telephone Encounter (Signed)
Left vm for the patient to call us back. ?

## 2021-08-21 DIAGNOSIS — R69 Illness, unspecified: Secondary | ICD-10-CM | POA: Diagnosis not present

## 2021-08-22 DIAGNOSIS — R69 Illness, unspecified: Secondary | ICD-10-CM | POA: Diagnosis not present

## 2021-09-28 DIAGNOSIS — R69 Illness, unspecified: Secondary | ICD-10-CM | POA: Diagnosis not present

## 2021-09-29 DIAGNOSIS — R69 Illness, unspecified: Secondary | ICD-10-CM | POA: Diagnosis not present

## 2021-10-20 DIAGNOSIS — R69 Illness, unspecified: Secondary | ICD-10-CM | POA: Diagnosis not present

## 2021-10-21 DIAGNOSIS — R69 Illness, unspecified: Secondary | ICD-10-CM | POA: Diagnosis not present

## 2021-10-25 ENCOUNTER — Ambulatory Visit (INDEPENDENT_AMBULATORY_CARE_PROVIDER_SITE_OTHER): Payer: No Typology Code available for payment source | Admitting: Nurse Practitioner

## 2021-10-25 ENCOUNTER — Encounter: Payer: Self-pay | Admitting: Nurse Practitioner

## 2021-10-25 VITALS — BP 143/93 | HR 81 | Temp 97.3°F | Ht 70.0 in | Wt 237.4 lb

## 2021-10-25 DIAGNOSIS — M25561 Pain in right knee: Secondary | ICD-10-CM | POA: Diagnosis not present

## 2021-10-25 DIAGNOSIS — Z0000 Encounter for general adult medical examination without abnormal findings: Secondary | ICD-10-CM | POA: Diagnosis not present

## 2021-10-25 DIAGNOSIS — Z599 Problem related to housing and economic circumstances, unspecified: Secondary | ICD-10-CM

## 2021-10-25 DIAGNOSIS — R69 Illness, unspecified: Secondary | ICD-10-CM | POA: Diagnosis not present

## 2021-10-25 DIAGNOSIS — I10 Essential (primary) hypertension: Secondary | ICD-10-CM

## 2021-10-25 DIAGNOSIS — M25562 Pain in left knee: Secondary | ICD-10-CM

## 2021-10-25 LAB — POCT GLYCOSYLATED HEMOGLOBIN (HGB A1C)
HbA1c POC (<> result, manual entry): 5.1 % (ref 4.0–5.6)
HbA1c, POC (controlled diabetic range): 5.1 % (ref 0.0–7.0)
HbA1c, POC (prediabetic range): 5.1 % — AB (ref 5.7–6.4)
Hemoglobin A1C: 5.1 % (ref 4.0–5.6)

## 2021-10-25 MED ORDER — OLMESARTAN MEDOXOMIL-HCTZ 20-12.5 MG PO TABS: 1.0000 | ORAL_TABLET | Freq: Every day | ORAL | 0 refills | Status: DC

## 2021-10-25 NOTE — Progress Notes (Signed)
[address] [place], [place]  [address] Phone:  [phone]   Fax:  [phone] Subjective:   Patient ID: Michelle Villarreal, female    DOB: 1946/04/09, 76 y.o.   MRN: [id number]  Chief Complaint  Patient presents with   Follow-up    Pt is here for 6 month's follow up visit. Pt stated both knees has swelling and pain   HPI Michelle Villarreal 75 y.o. female  has a past medical history of Adenoma, Bladder incontinence, Cataracts, bilateral, Chronic pain, Fall, Fibromyalgia, Hypertension, Neuropathy, Osteoarthritis, and Tinnitus. To the [facility] for  6 mths follow up and chronic bilateral knee pain and swelling.  Concerned today about worsening chronic arthritis in knee pain and multiple joints in the bilateral hands. States that she also has progressive swelling in the bilateral knees. Currently wears compression stockings regularly with moderate, temporary relief in swelling. Has been taking OTC medications for pain with mild to moderate relief.   Also concerned about possible depression, states that she moved into a senior community 2 yrs ago. States, "I have had difficulty accepting retirement. Working my entire life and not being financially stable. " She also indicates that the community center does not have enough activities for her.   Hypertension: Patient here for follow-up of elevated blood pressure. She is exercising and is adherent to low salt diet.  Blood pressure is well controlled at home. Cardiac symptoms none. Patient denies chest pain, claudication, dyspnea, and fatigue.  Cardiovascular risk factors: advanced age (older than 76 for men, 36 for women) and hypertension. Use of agents associated with hypertension: none. History of target organ damage: none.   Denies any other concerns today. Denies any fatigue, chest pain, shortness of breath, HA or dizziness. Denies any blurred vision, numbness or tingling.   Past Medical History:  Diagnosis Date    Adenoma    adrenal gland   Bladder incontinence    Cataracts, bilateral    Chronic pain    Fall    09/30/13, 03/15/15, 06/14/15   Fibromyalgia    Hypertension    Neuropathy    Osteoarthritis    Tinnitus     Past Surgical History:  Procedure Laterality Date   BREAST REDUCTION SURGERY     BUNIONECTOMY     REDUCTION MAMMAPLASTY Bilateral    REPLACEMENT TOTAL KNEE Bilateral 2003/2004   TUBAL LIGATION     VAGINAL HYSTERECTOMY      Family History  Problem Relation Age of Onset   Ovarian cancer Mother    Diabetes Sister    Arthritis Brother    Stroke Neg Hx    Migraines Neg Hx    Ataxia Neg Hx     Social History   Socioeconomic History   Marital status: Single    Spouse name: Not on file   Number of children: 1   Years of education: Not on file   Highest education level: Not on file  Occupational History   Not on file  Tobacco Use   Smoking status: Former    Years: 5.00    Types: Cigarettes   Smokeless tobacco: Never  Vaping Use   Vaping Use: Never used  Substance and Sexual Activity   Alcohol use: Yes   Drug use: No   Sexual activity: Never  Other Topics Concern   Not on file  Social History Narrative   Lives alone   Caffeine use: minimal    Social Determinants of Health  Financial Resource Strain: Low Risk  (10/21/2020)   Overall Financial Resource Strain (CARDIA)    Difficulty of Paying Living Expenses: Not hard at all  Food Insecurity: No Food Insecurity (10/21/2020)   Hunger Vital Sign    Worried About Running Out of Food in the Last Year: Never true    Ran Out of Food in the Last Year: Never true  Transportation Needs: No Transportation Needs (10/21/2020)   PRAPARE - Hydrologist (Medical): No    Lack of Transportation (Non-Medical): No  Physical Activity: Insufficiently Active (10/21/2020)   Exercise Vital Sign    Days of Exercise per Week: 1 day    Minutes of Exercise per Session: 30 min  Stress: Stress Concern  Present (10/21/2020)   [place]    Feeling of Stress : Very much  Social Connections: Unknown (10/21/2020)   Social Connection and Isolation Panel [NHANES]    Frequency of Communication with Friends and Family: More than three times a week    Frequency of Social Gatherings with Friends and Family: More than three times a week    Attends Religious Services: 1 to 4 times per year    Active Member of [employer] or Organizations: No    Attends Archivist Meetings: Never    Marital Status: Patient refused  Intimate Partner Violence: Not At Risk (10/21/2020)   Humiliation, Afraid, Rape, and Kick questionnaire    Fear of Current or Ex-Partner: No    Emotionally Abused: No    Physically Abused: No    Sexually Abused: No    Outpatient Medications Prior to Visit  Medication Sig Dispense Refill   aspirin 81 MG chewable tablet Chew by mouth as needed.     Biotin 1000 MCG CHEW Chew by mouth.     Camphor-Menthol-Methyl Sal (SALONPAS EX) Apply topically.     Chlorpheniramine Maleate (ALLERGY PO) Take by mouth.     Cholecalciferol (VITAMIN D3 PO) Take 800 Units by mouth 3 (three) times a week.     guaiFENesin (MUCINEX) 600 MG 12 hr tablet Take by mouth 2 (two) times daily.     Liniments (SALONPAS PAIN RELIEF PATCH EX) Apply topically daily as needed.     Methen-Hyosc-Meth Blue-Na Phos (UROGESIC-BLUE PO) Take 1 tablet by mouth 4 (four) times daily as needed.     naproxen sodium (ANAPROX) 220 MG tablet Take 220 mg by mouth every 8 (eight) hours as needed. AS NEEDED     vitamin E 400 UNIT capsule Take 400-800 Units by mouth. 3-4 times weekly per pt     olmesartan-hydrochlorothiazide (BENICAR HCT) 20-12.5 MG tablet Take 1 tablet by mouth daily. 90 tablet 0   No facility-administered medications prior to visit.    Allergies  Allergen Reactions   Ultram [Tramadol] Shortness Of Breath   Acetazolamide     Other reaction(s): severe  headaches   Benzodiazepines Other (See Comments)    Short term memory loss   Codeine Itching    Can take with benadryl in certain time length   Demerol [Meperidine] Nausea Only   Duloxetine Hcl     Other reaction(s): memory loss short term   Fentanyl     Other reaction(s): itching   Fondaparinux Other (See Comments)    Increased bleeding Other reaction(s): increase bleeding potential   Gabapentin Other (See Comments)    Memory loss   Lyrica [Pregabalin] Other (See Comments)    Memory loss  Morphine And Related Hives   Oxycodone-Acetaminophen     Other reaction(s): itching   Pentazocine     Other reaction(s): Itching   Zoloft [Sertraline Hcl] Other (See Comments)    Memory loss    Review of Systems  Constitutional:  Negative for chills, fever and malaise/fatigue.  Respiratory:  Negative for cough and shortness of breath.   Cardiovascular:  Negative for chest pain, palpitations and leg swelling.  Gastrointestinal:  Negative for abdominal pain, blood in stool, constipation, diarrhea, nausea and vomiting.  Musculoskeletal:  Positive for joint pain. Negative for back pain, falls, myalgias and neck pain.  Skin: Negative.   Neurological: Negative.   Psychiatric/Behavioral:  Positive for depression. The patient is nervous/anxious.   All other systems reviewed and are negative.      Objective:    Physical Exam Vitals reviewed.  Constitutional:      General: She is not in acute distress.    Appearance: Normal appearance. She is obese.  HENT:     Head: Normocephalic.  Cardiovascular:     Rate and Rhythm: Normal rate and regular rhythm.     Pulses: Normal pulses.     Heart sounds: Normal heart sounds.     Comments: No obvious peripheral edema Pulmonary:     Effort: Pulmonary effort is normal.     Breath sounds: Normal breath sounds.  Abdominal:     General: Abdomen is flat. Bowel sounds are normal. There is no distension.     Palpations: Abdomen is soft. There is no  mass.     Tenderness: There is no abdominal tenderness. There is no right CVA tenderness, left CVA tenderness, guarding or rebound.     Hernia: No hernia is present.  Musculoskeletal:        General: No swelling, tenderness, deformity or signs of injury. Normal range of motion.     Right lower leg: No edema.     Left lower leg: No edema.  Skin:    General: Skin is warm and dry.     Capillary Refill: Capillary refill takes less than 2 seconds.  Neurological:     General: No focal deficit present.     Mental Status: She is alert and oriented to person, place, and time.  Psychiatric:        Mood and Affect: Mood normal.        Behavior: Behavior normal.        Thought Content: Thought content normal.        Judgment: Judgment normal.     BP (!) 143/93 (BP Location: Left Arm, Patient Position: Sitting, Cuff Size: Large)   Pulse 81   Temp (!) 97.3 F (36.3 C)   Ht '5\' 10"'  (1.778 m)   Wt 237 lb 6 oz (107.7 kg)   BMI 34.06 kg/m  Wt Readings from Last 3 Encounters:  10/25/21 237 lb 6 oz (107.7 kg)  04/26/21 234 lb (106.1 kg)  10/24/20 238 lb 0.2 oz (108 kg)    Immunization History  Administered Date(s) Administered   Influenza Split 02/19/2013, 04/19/2014, 01/17/2015, 01/21/2016, 02/07/2017, 01/22/2018, 01/30/2019, 01/14/2020   Influenza, Peddy Dose Seasonal PF 02/07/2017   Influenza-Unspecified 02/18/2021   PFIZER(Purple Top)SARS-COV-2 Vaccination 06/25/2019, 07/20/2019, 03/11/2020   Pneumococcal Conjugate-13 01/27/2014   Pneumococcal Polysaccharide-23 01/28/2013   Tdap 12/02/2017    Diabetic Foot Exam - Simple   No data filed     Lab Results  Component Value Date   TSH 2.150 12/28/2015   Lab Results  Component Value Date   WBC 4.7 10/25/2021   HGB 13.1 10/25/2021   HCT 38.4 10/25/2021   MCV 93 10/25/2021   PLT 164 10/25/2021   Lab Results  Component Value Date   NA 143 10/25/2021   K 4.1 10/25/2021   CO2 25 10/25/2021   GLUCOSE 94 10/25/2021   BUN 11  10/25/2021   CREATININE 0.59 10/25/2021   BILITOT 0.4 10/25/2021   ALKPHOS 70 10/25/2021   AST 46 (H) 10/25/2021   ALT 36 (H) 10/25/2021   PROT 7.3 10/25/2021   ALBUMIN 4.4 10/25/2021   CALCIUM 9.5 10/25/2021   ANIONGAP 7 05/06/2017   EGFR 95 10/25/2021   Lab Results  Component Value Date   CHOL 214 (H) 10/25/2021   CHOL 220 (H) 04/26/2021   Lab Results  Component Value Date   HDL 88 10/25/2021   HDL 80 04/26/2021   Lab Results  Component Value Date   LDLCALC 117 (H) 10/25/2021   LDLCALC 131 (H) 04/26/2021   Lab Results  Component Value Date   TRIG 53 10/25/2021   TRIG 50 04/26/2021   Lab Results  Component Value Date   CHOLHDL 2.4 10/25/2021   CHOLHDL 2.8 04/26/2021   Lab Results  Component Value Date   HGBA1C 5.1 10/25/2021   HGBA1C 5.1 10/25/2021   HGBA1C 5.1 (A) 10/25/2021   HGBA1C 5.1 10/25/2021       Assessment & Plan:   Problem List Items Addressed This Visit       Cardiovascular and Mediastinum   Essential hypertension   Relevant Medications   olmesartan-hydrochlorothiazide (BENICAR HCT) 20-12.5 MG tablet, refilled without change  Encouraged continued diet and exercise efforts  Encouraged continued compliance with medication     Other Relevant Orders   CBC with Differential/Platelet (Completed)   Comprehensive metabolic panel (Completed)   Lipid panel (Completed) Encouraged to continue checking B?P at home regularly   Other Visit Diagnoses     Health care maintenance    -  Primary   Relevant Orders   POCT glycosylated hemoglobin (Hb A1C) (Completed)   CBC with Differential/Platelet (Completed)   Comprehensive metabolic panel (Completed)   Lipid panel (Completed)   Vitamin D, 25-hydroxy (Completed)   Pain in both knees, unspecified chronicity       Relevant Orders   POCT glycosylated hemoglobin (Hb A1C) (Completed)   Financial problems       Follow up in 6 mths for reevaluation of chronic illness, sooner as needed    Patient  states that she will call primary orthopedics for further evaluation of knees. Has history of bilateral knee replacement.   I am having Jenna O. Weidemann maintain her naproxen sodium, vitamin E, Cholecalciferol (VITAMIN D3 PO), Methen-Hyosc-Meth Blue-Na Phos (UROGESIC-BLUE PO), Liniments (SALONPAS PAIN RELIEF PATCH EX), guaiFENesin, Camphor-Menthol-Methyl Sal (SALONPAS EX), Chlorpheniramine Maleate (ALLERGY PO), Biotin, aspirin, and olmesartan-hydrochlorothiazide.  Meds ordered this encounter  Medications   olmesartan-hydrochlorothiazide (BENICAR HCT) 20-12.5 MG tablet    Sig: Take 1 tablet by mouth daily.    Dispense:  90 tablet    Refill:  0     Teena Dunk, NP

## 2021-10-25 NOTE — Patient Instructions (Signed)
You were seen today in the [facility] for reevaluation of chronic illness . Labs were collected, results will be available via MyChart or, if abnormal, you will be contacted by clinic staff. You were prescribed medications, please take as directed. Please follow up in 6 mths for reevaluation of chronic illness

## 2021-10-26 DIAGNOSIS — R69 Illness, unspecified: Secondary | ICD-10-CM | POA: Diagnosis not present

## 2021-10-26 LAB — COMPREHENSIVE METABOLIC PANEL
ALT: 36 IU/L — ABNORMAL HIGH (ref 0–32)
AST: 46 IU/L — ABNORMAL HIGH (ref 0–40)
Albumin/Globulin Ratio: 1.5 (ref 1.2–2.2)
Albumin: 4.4 g/dL (ref 3.7–4.7)
Alkaline Phosphatase: 70 IU/L (ref 44–121)
BUN/Creatinine Ratio: 19 (ref 12–28)
BUN: 11 mg/dL (ref 8–27)
Bilirubin Total: 0.4 mg/dL (ref 0.0–1.2)
CO2: 25 mmol/L (ref 20–29)
Calcium: 9.5 mg/dL (ref 8.7–10.3)
Chloride: 103 mmol/L (ref 96–106)
Creatinine, Ser: 0.59 mg/dL (ref 0.57–1.00)
Globulin, Total: 2.9 g/dL (ref 1.5–4.5)
Glucose: 94 mg/dL (ref 70–99)
Potassium: 4.1 mmol/L (ref 3.5–5.2)
Sodium: 143 mmol/L (ref 134–144)
Total Protein: 7.3 g/dL (ref 6.0–8.5)
eGFR: 95 mL/min/{1.73_m2} (ref >59)

## 2021-10-26 LAB — CBC WITH DIFFERENTIAL/PLATELET
Basophils Absolute: 0 10*3/uL (ref 0.0–0.2)
Basos: 0 %
EOS (ABSOLUTE): 0.3 10*3/uL (ref 0.0–0.4)
Eos: 6 %
Hematocrit: 38.4 % (ref 34.0–46.6)
Hemoglobin: 13.1 g/dL (ref 11.1–15.9)
Immature Grans (Abs): 0 10*3/uL (ref 0.0–0.1)
Immature Granulocytes: 0 %
Lymphocytes Absolute: 1.1 10*3/uL (ref 0.7–3.1)
Lymphs: 24 %
MCH: 31.6 pg (ref 26.6–33.0)
MCHC: 34.1 g/dL (ref 31.5–35.7)
MCV: 93 fL (ref 79–97)
Monocytes Absolute: 0.5 10*3/uL (ref 0.1–0.9)
Monocytes: 11 %
Neutrophils Absolute: 2.8 10*3/uL (ref 1.4–7.0)
Neutrophils: 59 %
Platelets: 164 10*3/uL (ref 150–450)
RBC: 4.15 x10E6/uL (ref 3.77–5.28)
RDW: 13 % (ref 11.7–15.4)
WBC: 4.7 10*3/uL (ref 3.4–10.8)

## 2021-10-26 LAB — LIPID PANEL
Chol/HDL Ratio: 2.4 ratio (ref 0.0–4.4)
Cholesterol, Total: 214 mg/dL — ABNORMAL HIGH (ref 100–199)
HDL: 88 mg/dL (ref >39)
LDL Chol Calc (NIH): 117 mg/dL — ABNORMAL HIGH (ref 0–99)
Triglycerides: 53 mg/dL (ref 0–149)
VLDL Cholesterol Cal: 9 mg/dL (ref 5–40)

## 2021-10-26 LAB — VITAMIN D 25 HYDROXY (VIT D DEFICIENCY, FRACTURES): Vit D, 25-Hydroxy: 30.4 ng/mL (ref 30.0–100.0)

## 2021-10-27 ENCOUNTER — Encounter: Payer: Self-pay | Admitting: Nurse Practitioner

## 2021-11-02 DIAGNOSIS — H524 Presbyopia: Secondary | ICD-10-CM | POA: Diagnosis not present

## 2021-11-07 DIAGNOSIS — H52209 Unspecified astigmatism, unspecified eye: Secondary | ICD-10-CM | POA: Diagnosis not present

## 2021-11-07 DIAGNOSIS — H5203 Hypermetropia, bilateral: Secondary | ICD-10-CM | POA: Diagnosis not present

## 2021-11-07 DIAGNOSIS — H524 Presbyopia: Secondary | ICD-10-CM | POA: Diagnosis not present

## 2022-01-19 ENCOUNTER — Other Ambulatory Visit: Payer: Self-pay | Admitting: Nurse Practitioner

## 2022-01-19 DIAGNOSIS — I10 Essential (primary) hypertension: Secondary | ICD-10-CM

## 2022-01-19 MED ORDER — OLMESARTAN MEDOXOMIL-HCTZ 20-12.5 MG PO TABS: 1.0000 | ORAL_TABLET | Freq: Every day | ORAL | 0 refills | Status: DC

## 2022-01-23 DIAGNOSIS — R69 Illness, unspecified: Secondary | ICD-10-CM | POA: Diagnosis not present

## 2022-01-24 DIAGNOSIS — R69 Illness, unspecified: Secondary | ICD-10-CM | POA: Diagnosis not present

## 2022-01-28 IMAGING — DX DG CHEST 2V
2 series · 3 of 3 positions shown · non-contrast
Comparison: 12/03/2019 chest radiograph.

CLINICAL DATA: Cough

EXAM:
CHEST - 2 VIEW

[Series 2: chest pa · 0.14mm/px · 2 of 2 slices shown]
[im 1/2]
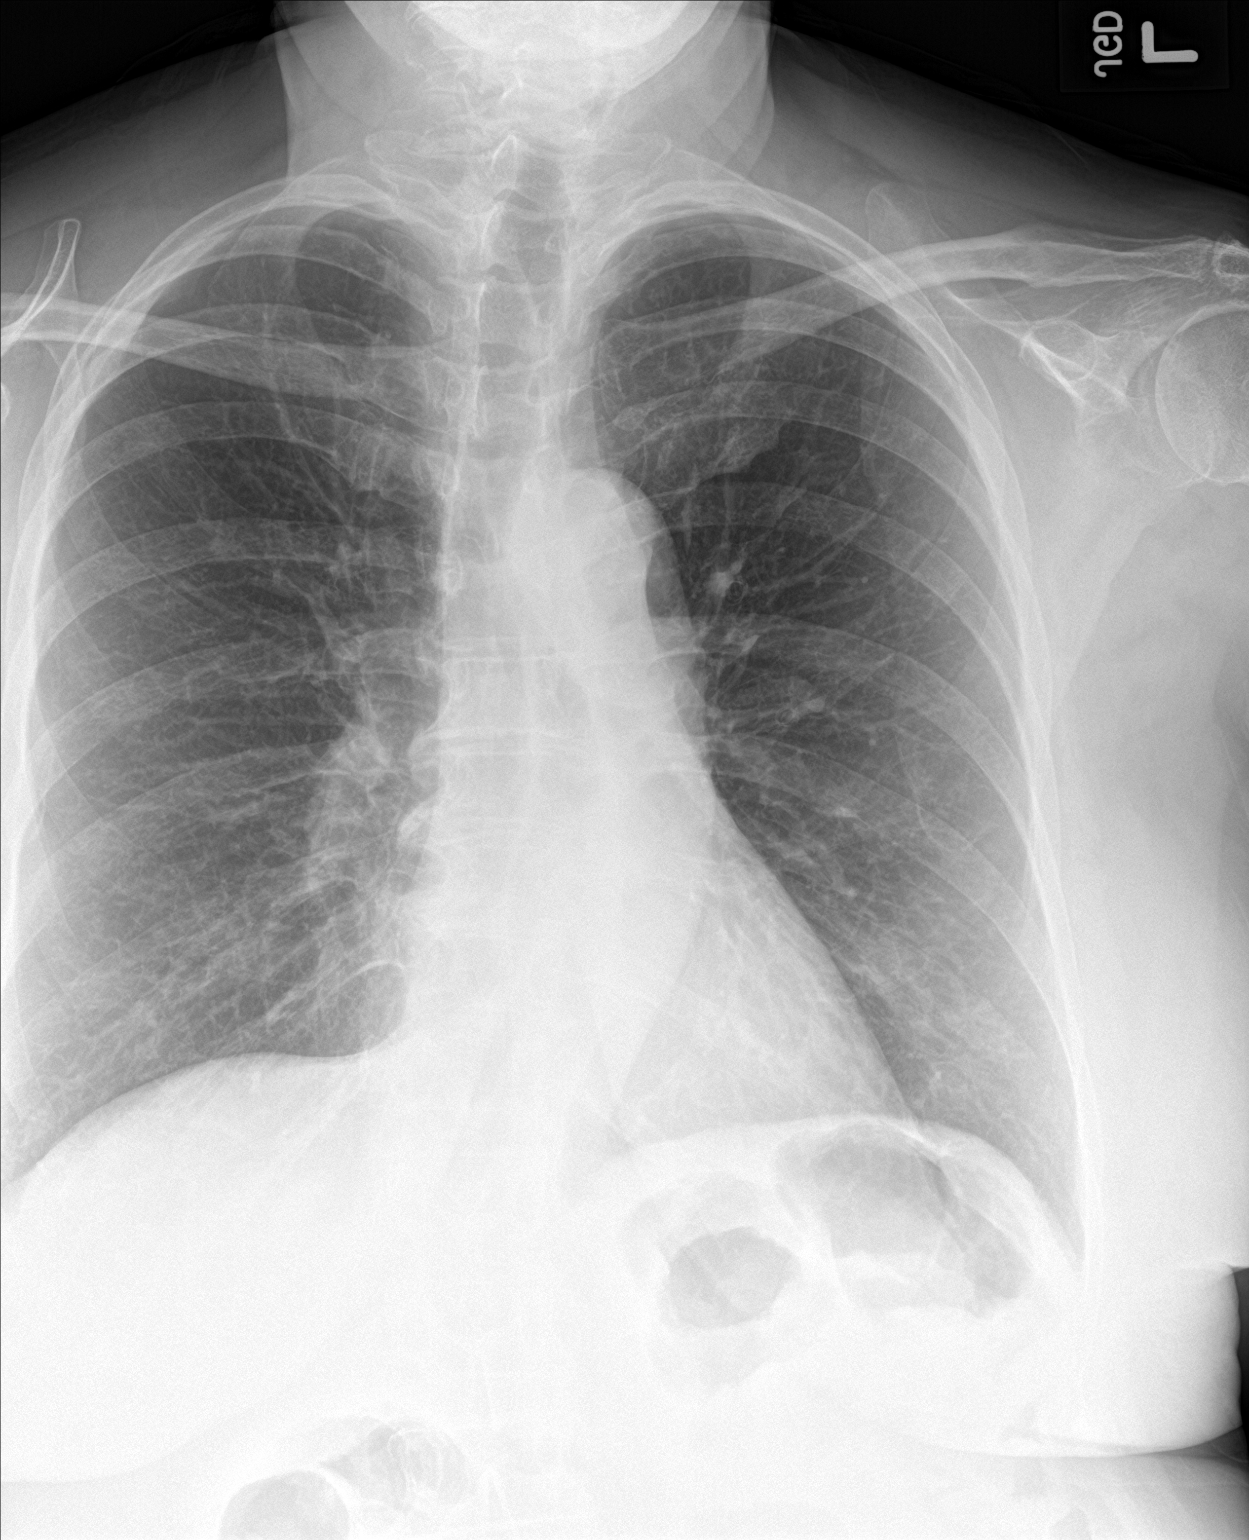
[im 2/2]
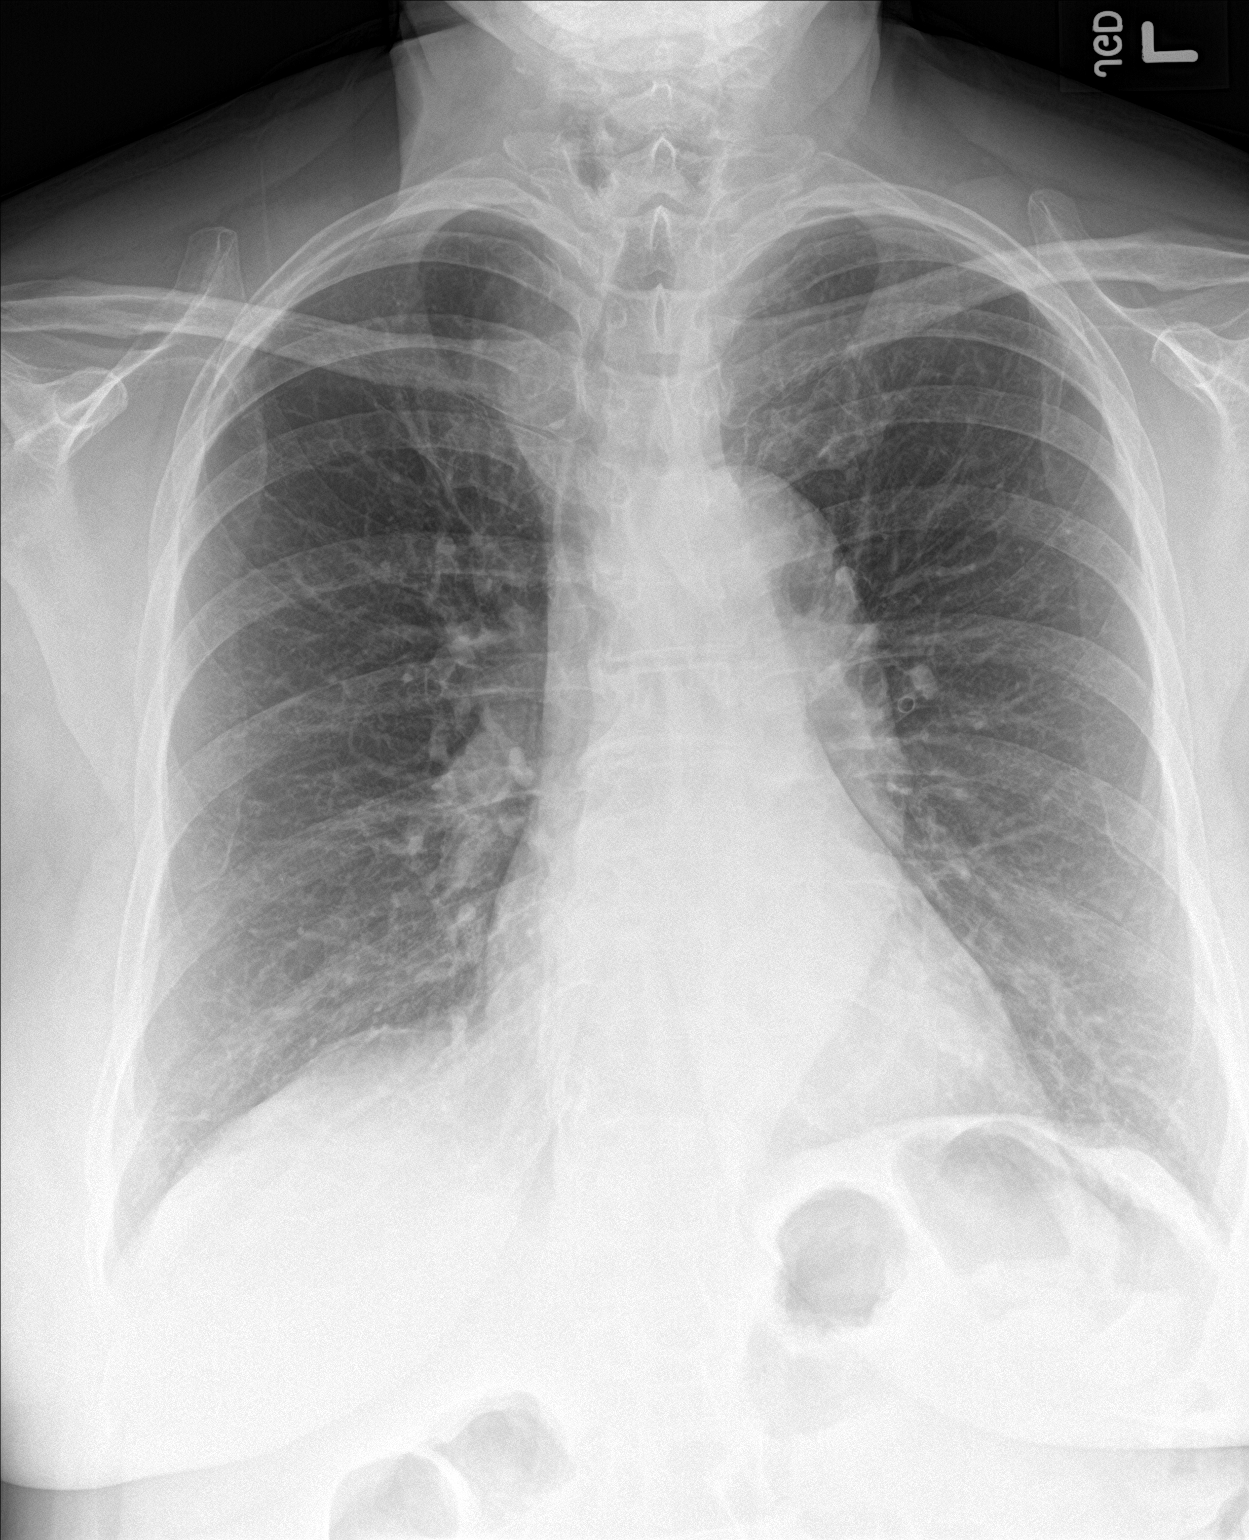

[chest lat]
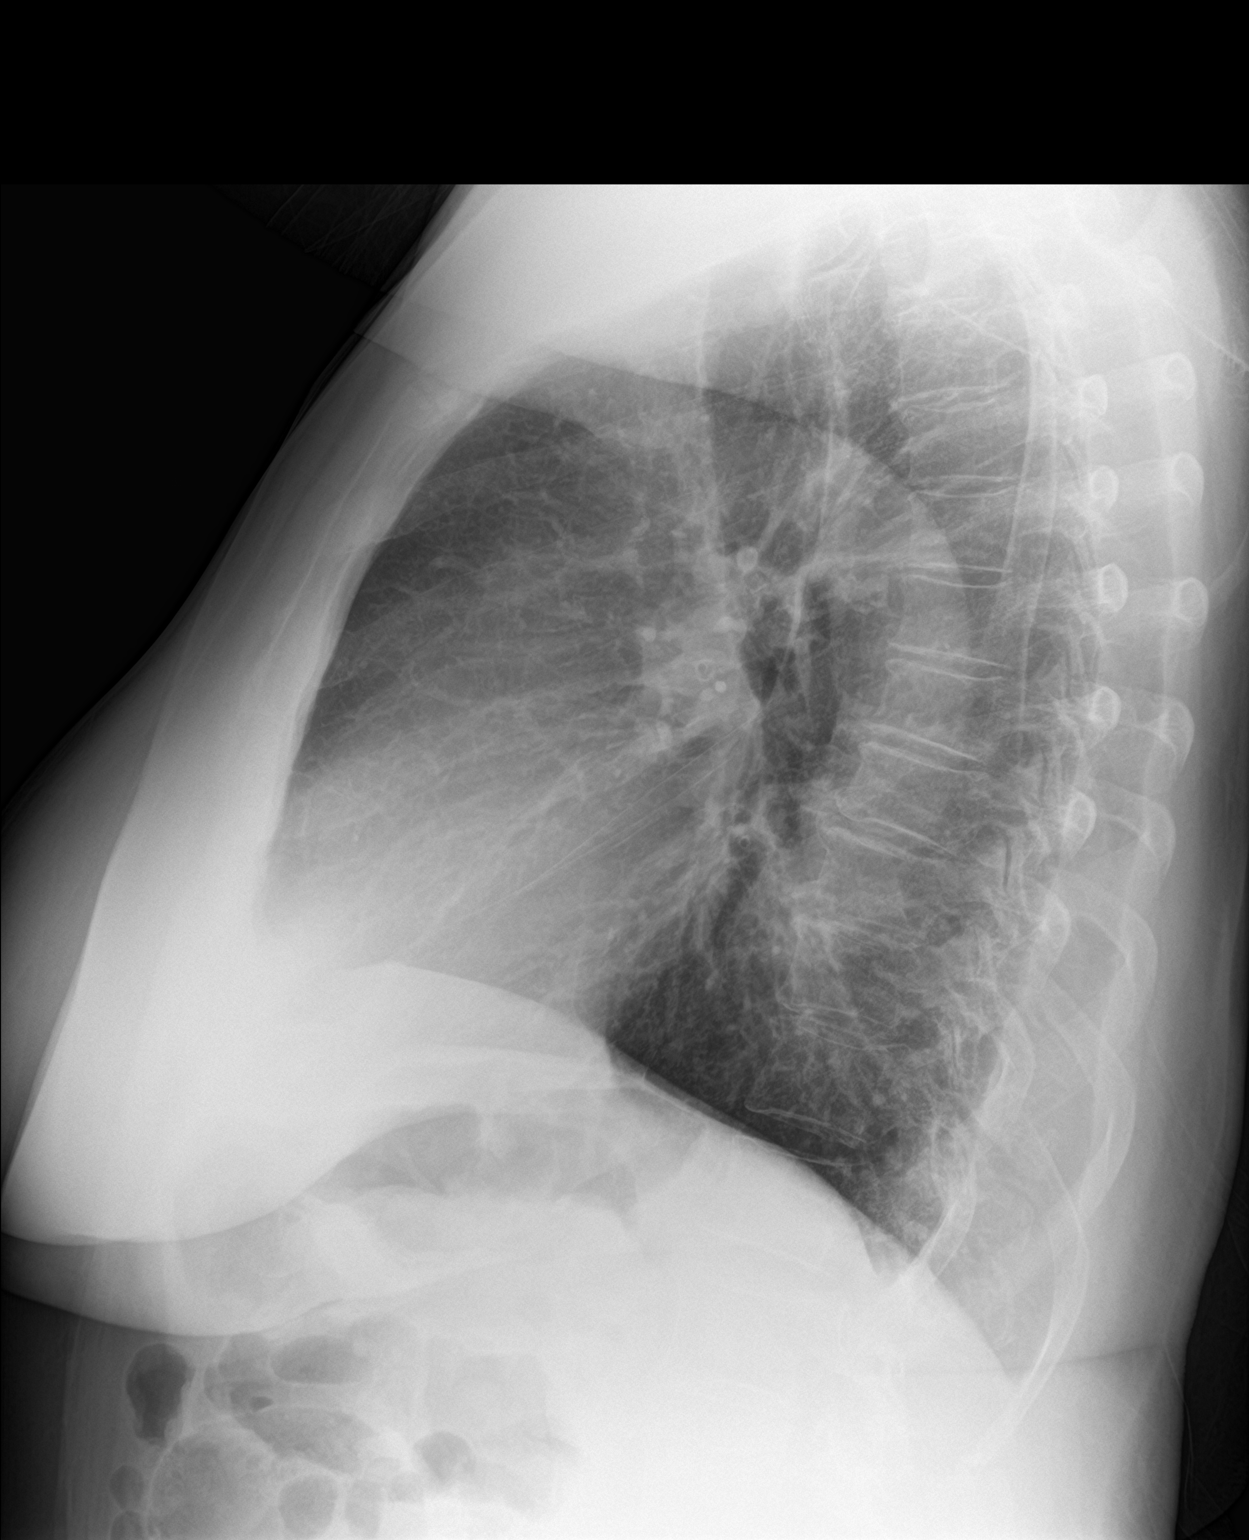

[3 of 3 positions shown; findings below may reference images not displayed]

FINDINGS: Stable cardiomediastinal silhouette with normal heart size. No
pneumothorax. No pleural effusion. Lungs appear clear, with no acute
consolidative airspace disease and no pulmonary edema.
IMPRESSION: No active cardiopulmonary disease.

## 2022-02-12 DIAGNOSIS — R69 Illness, unspecified: Secondary | ICD-10-CM | POA: Diagnosis not present

## 2022-02-13 DIAGNOSIS — R69 Illness, unspecified: Secondary | ICD-10-CM | POA: Diagnosis not present

## 2022-02-21 ENCOUNTER — Ambulatory Visit (INDEPENDENT_AMBULATORY_CARE_PROVIDER_SITE_OTHER): Payer: No Typology Code available for payment source | Admitting: Nurse Practitioner

## 2022-02-21 ENCOUNTER — Encounter: Payer: Self-pay | Admitting: Nurse Practitioner

## 2022-02-21 VITALS — BP 114/82 | HR 81 | Temp 97.6°F | Ht 70.0 in | Wt 234.2 lb

## 2022-02-21 DIAGNOSIS — M810 Age-related osteoporosis without current pathological fracture: Secondary | ICD-10-CM | POA: Diagnosis not present

## 2022-02-21 DIAGNOSIS — M170 Bilateral primary osteoarthritis of knee: Secondary | ICD-10-CM | POA: Diagnosis not present

## 2022-02-21 NOTE — Patient Instructions (Addendum)
1. Age-related osteoporosis without current pathological fracture   2. Osteoarthritis of both knees, unspecified osteoarthritis type  Will complete forms for [employer] transportation once dropped off at the office   Follow up:  Follow up as scheduled

## 2022-02-21 NOTE — Assessment & Plan Note (Signed)
2. Osteoarthritis of both knees, unspecified osteoarthritis type  Will complete forms for [employer] transportation once dropped off at the office   Follow up:  Follow up as scheduled

## 2022-02-21 NOTE — Progress Notes (Signed)
$'@Patient'A$  ID: Michelle Villarreal, female    DOB: [birth year], 76 y.o.   MRN: [id number]  Chief Complaint  Patient presents with   Forms to be completed    Pt needs to be seen so forms can be completed.     Referring provider: Bo Merino I, NP   HPI  Michelle Villarreal 76 y.o. female  has a past medical history of Adenoma, Bladder incontinence, Cataracts, bilateral, Chronic pain, Fall, Fibromyalgia, Hypertension, Neuropathy, Osteoarthritis, and Tinnitus.    Patient presents today to have paperwork completed.  She brings a handicap placard with her today to be filled out this was filled out in the office today.  She states that she brought a form for [employer] for transportation and no one has seen this form.  Advised her to bring her to bring the form back to the office and we will complete this for her.  Patient needs to schedule transportation due to bilateral artificial knees and arthritis.  Patient lives in assisted living facility. Denies f/c/s, n/v/d, hemoptysis, PND, leg swelling Denies chest pain or edema     Allergies  Allergen Reactions   Ultram [Tramadol] Shortness Of Breath   Acetazolamide     Other reaction(s): severe headaches   Benzodiazepines Other (See Comments)    Short term memory loss   Codeine Itching    Can take with benadryl in certain time length   Demerol [Meperidine] Nausea Only   Duloxetine Hcl     Other reaction(s): memory loss short term   Fentanyl     Other reaction(s): itching   Fondaparinux Other (See Comments)    Increased bleeding Other reaction(s): increase bleeding potential   Gabapentin Other (See Comments)    Memory loss   Lyrica [Pregabalin] Other (See Comments)    Memory loss   Morphine And Related Hives   Oxycodone-Acetaminophen     Other reaction(s): itching   Pentazocine     Other reaction(s): Itching   Zoloft [Sertraline Hcl] Other (See Comments)    Memory loss    Immunization History  Administered Date(s) Administered    Influenza Split 02/19/2013, 04/19/2014, 01/17/2015, 01/21/2016, 02/07/2017, 01/22/2018, 01/30/2019, 01/14/2020   Influenza, [place] Dose Seasonal PF 02/07/2017   Influenza-Unspecified 02/18/2021   PFIZER(Purple Top)SARS-COV-2 Vaccination 06/25/2019, 07/20/2019, 03/11/2020   Pneumococcal Conjugate-13 01/27/2014   Pneumococcal Polysaccharide-23 01/28/2013   Tdap 12/02/2017   Zoster Recombinat (Shingrix) 11/01/2021    Past Medical History:  Diagnosis Date   Adenoma    adrenal gland   Bladder incontinence    Cataracts, bilateral    Chronic pain    Fall    09/30/13, 03/15/15, 06/14/15   Fibromyalgia    Hypertension    Neuropathy    Osteoarthritis    Tinnitus     Tobacco History: Social History   Tobacco Use  Smoking Status Former   Years: 5.00   Types: Cigarettes  Smokeless Tobacco Never   Counseling given: Not Answered   Outpatient Encounter Medications as of 02/21/2022  Medication Sig   aspirin 81 MG chewable tablet Chew by mouth as needed.   Biotin 1000 MCG CHEW Chew by mouth.   Camphor-Menthol-Methyl Sal (SALONPAS EX) Apply topically.   Chlorpheniramine Maleate (ALLERGY PO) Take by mouth.   Cholecalciferol (VITAMIN D3 PO) Take 800 Units by mouth 3 (three) times a week.   guaiFENesin (MUCINEX) 600 MG 12 hr tablet Take by mouth 2 (two) times daily.   Liniments (SALONPAS PAIN RELIEF PATCH EX) Apply topically daily as needed.  Methen-Hyosc-Meth Blue-Na Phos (UROGESIC-BLUE PO) Take 1 tablet by mouth 4 (four) times daily as needed.   naproxen sodium (ANAPROX) 220 MG tablet Take 220 mg by mouth every 8 (eight) hours as needed. AS NEEDED   olmesartan-hydrochlorothiazide (BENICAR HCT) 20-12.5 MG tablet Take 1 tablet by mouth daily.   vitamin E 400 UNIT capsule Take 400-800 Units by mouth. 3-4 times weekly per pt   No facility-administered encounter medications on file as of 02/21/2022.     Review of Systems  Review of Systems  Constitutional: Negative.   HENT: Negative.     Cardiovascular: Negative.   Gastrointestinal: Negative.   Allergic/Immunologic: Negative.   Neurological: Negative.   Psychiatric/Behavioral: Negative.         Physical Exam  BP 114/82   Pulse 81   Temp 97.6 F (36.4 C) (Temporal)   Ht '5\' 10"'$  (1.778 m)   Wt 234 lb 3.2 oz (106.2 kg)   SpO2 98%   BMI 33.60 kg/m   Wt Readings from Last 5 Encounters:  02/21/22 234 lb 3.2 oz (106.2 kg)  10/25/21 237 lb 6 oz (107.7 kg)  04/26/21 234 lb (106.1 kg)  10/24/20 238 lb 0.2 oz (108 kg)  10/21/20 241 lb (109.3 kg)     Physical Exam Vitals and nursing note reviewed.  Constitutional:      General: She is not in acute distress.    Appearance: She is well-developed.  Cardiovascular:     Rate and Rhythm: Normal rate and regular rhythm.  Pulmonary:     Effort: Pulmonary effort is normal.     Breath sounds: Normal breath sounds.  Neurological:     Mental Status: She is alert and oriented to person, place, and time.      Lab Results:  CBC    Component Value Date/Time   WBC 4.7 10/25/2021 1041   WBC 6.5 05/06/2017 1051   RBC 4.15 10/25/2021 1041   RBC 4.46 05/06/2017 1051   HGB 13.1 10/25/2021 1041   HCT 38.4 10/25/2021 1041   PLT 164 10/25/2021 1041   MCV 93 10/25/2021 1041   MCH 31.6 10/25/2021 1041   MCH 30.9 05/06/2017 1051   MCHC 34.1 10/25/2021 1041   MCHC 32.5 05/06/2017 1051   RDW 13.0 10/25/2021 1041   LYMPHSABS 1.1 10/25/2021 1041   MONOABS 0.4 05/06/2017 1051   EOSABS 0.3 10/25/2021 1041   BASOSABS 0.0 10/25/2021 1041    BMET    Component Value Date/Time   NA 143 10/25/2021 1041   K 4.1 10/25/2021 1041   CL 103 10/25/2021 1041   CO2 25 10/25/2021 1041   GLUCOSE 94 10/25/2021 1041   GLUCOSE 104 (H) 05/06/2017 1051   BUN 11 10/25/2021 1041   CREATININE 0.59 10/25/2021 1041   CALCIUM 9.5 10/25/2021 1041   GFRNONAA >60 05/06/2017 1051   GFRAA >60 05/06/2017 1051      Assessment & Plan:   Age-related osteoporosis without current  pathological fracture 2. Osteoarthritis of both knees, unspecified osteoarthritis type  Will complete forms for [employer] transportation once dropped off at the office   Follow up:  Follow up as scheduled     Fenton Foy, NP 02/21/2022

## 2022-02-22 ENCOUNTER — Ambulatory Visit: Payer: Self-pay | Admitting: Nurse Practitioner

## 2022-03-08 ENCOUNTER — Other Ambulatory Visit: Payer: Self-pay | Admitting: Nurse Practitioner

## 2022-03-08 DIAGNOSIS — I10 Essential (primary) hypertension: Secondary | ICD-10-CM

## 2022-03-09 DIAGNOSIS — R69 Illness, unspecified: Secondary | ICD-10-CM | POA: Diagnosis not present

## 2022-03-19 DIAGNOSIS — R69 Illness, unspecified: Secondary | ICD-10-CM | POA: Diagnosis not present

## 2022-03-19 DIAGNOSIS — H25813 Combined forms of age-related cataract, bilateral: Secondary | ICD-10-CM | POA: Diagnosis not present

## 2022-03-20 DIAGNOSIS — R69 Illness, unspecified: Secondary | ICD-10-CM | POA: Diagnosis not present

## 2022-04-09 DIAGNOSIS — R69 Illness, unspecified: Secondary | ICD-10-CM | POA: Diagnosis not present

## 2022-04-10 DIAGNOSIS — R69 Illness, unspecified: Secondary | ICD-10-CM | POA: Diagnosis not present

## 2022-04-26 ENCOUNTER — Ambulatory Visit: Payer: Medicare HMO | Admitting: Nurse Practitioner

## 2022-05-03 ENCOUNTER — Telehealth: Payer: Self-pay | Admitting: Nurse Practitioner

## 2022-05-03 NOTE — Telephone Encounter (Signed)
Left message for patient to call back and schedule Medicare Annual Wellness Visit (AWV).   Please offer to do virtually or by telephone.   Last AWV:  10/21/2020   Schedule at any time with [facility]   30 minute appointment for Virtual or phone  45 minute appointment for Initial virtual/phone  Any questions, please contact me at [phone]   Thank you,   [facility]  Ambulatory Clinical Support for Care Management [employer] Are. We Are. [facility] ??[phone] or ??[phone]

## 2022-05-10 NOTE — Progress Notes (Deleted)
Subjective:   Michelle Villarreal is a 76 y.o. female who presents for Medicare Annual (Subsequent) preventive examination.  Review of Systems    ***       Objective:    There were no vitals filed for this visit. There is no height or weight on file to calculate BMI.     05/30/2019    2:42 PM 07/20/2015    9:42 PM  Advanced Directives  Does Patient Have a Medical Advance Directive? No No  Would patient like information on creating a medical advance directive? No - Patient declined     Current Medications (verified) Outpatient Encounter Medications as of 05/10/2022  Medication Sig   aspirin 81 MG chewable tablet Chew by mouth as needed.   Biotin 1000 MCG CHEW Chew by mouth.   Camphor-Menthol-Methyl Sal (SALONPAS EX) Apply topically.   Chlorpheniramine Maleate (ALLERGY PO) Take by mouth.   Cholecalciferol (VITAMIN D3 PO) Take 800 Units by mouth 3 (three) times a week.   guaiFENesin (MUCINEX) 600 MG 12 hr tablet Take by mouth 2 (two) times daily.   Liniments (SALONPAS PAIN RELIEF PATCH EX) Apply topically daily as needed.   Methen-Hyosc-Meth Blue-Na Phos (UROGESIC-BLUE PO) Take 1 tablet by mouth 4 (four) times daily as needed.   naproxen sodium (ANAPROX) 220 MG tablet Take 220 mg by mouth every 8 (eight) hours as needed. AS NEEDED   olmesartan-hydrochlorothiazide (BENICAR HCT) 20-12.5 MG tablet Take 1 tablet by mouth daily.   vitamin E 400 UNIT capsule Take 400-800 Units by mouth. 3-4 times weekly per pt   No facility-administered encounter medications on file as of 05/10/2022.    Allergies (verified) Ultram [tramadol], Acetazolamide, Benzodiazepines, Codeine, Demerol [meperidine], Duloxetine hcl, Fentanyl, Fondaparinux, Gabapentin, Lyrica [pregabalin], Morphine and related, Oxycodone-acetaminophen, Pentazocine, and Zoloft [sertraline hcl]   History: Past Medical History:  Diagnosis Date   Adenoma    adrenal gland   Bladder incontinence    Cataracts, bilateral    Chronic  pain    Fall    09/30/13, 03/15/15, 06/14/15   Fibromyalgia    Hypertension    Neuropathy    Osteoarthritis    Tinnitus    Past Surgical History:  Procedure Laterality Date   BREAST REDUCTION SURGERY     BUNIONECTOMY     REDUCTION MAMMAPLASTY Bilateral    REPLACEMENT TOTAL KNEE Bilateral 2003/2004   TUBAL LIGATION     VAGINAL HYSTERECTOMY     Family History  Problem Relation Age of Onset   Ovarian cancer Mother    Diabetes Sister    Arthritis Brother    Stroke Neg Hx    Migraines Neg Hx    Ataxia Neg Hx    Social History   Socioeconomic History   Marital status: Single    Spouse name: Not on file   Number of children: 1   Years of education: Not on file   Highest education level: Not on file  Occupational History   Not on file  Tobacco Use   Smoking status: Former    Years: 5.00    Types: Cigarettes   Smokeless tobacco: Never  Vaping Use   Vaping Use: Never used  Substance and Sexual Activity   Alcohol use: Yes   Drug use: No   Sexual activity: Never  Other Topics Concern   Not on file  Social History Narrative   Lives alone   Caffeine use: minimal    Social Determinants of Health   Financial Resource Strain: Low Risk  (  10/21/2020)   Overall Financial Resource Strain (CARDIA)    Difficulty of Paying Living Expenses: Not hard at all  Food Insecurity: No Food Insecurity (10/21/2020)   Hunger Vital Sign    Worried About Running Out of Food in the Last Year: Never true    Ran Out of Food in the Last Year: Never true  Transportation Needs: No Transportation Needs (10/21/2020)   PRAPARE - Hydrologist (Medical): No    Lack of Transportation (Non-Medical): No  Physical Activity: Insufficiently Active (10/21/2020)   Exercise Vital Sign    Days of Exercise per Week: 1 day    Minutes of Exercise per Session: 30 min  Stress: Stress Concern Present (10/21/2020)   [place]    Feeling of Stress : Very much  Social Connections: Unknown (10/21/2020)   Social Connection and Isolation Panel [NHANES]    Frequency of Communication with Friends and Family: More than three times a week    Frequency of Social Gatherings with Friends and Family: More than three times a week    Attends Religious Services: 1 to 4 times per year    Active Member of [employer] or Organizations: No    Attends Archivist Meetings: Never    Marital Status: Patient refused    Tobacco Counseling Counseling given: Not Answered   Clinical Intake:                 Diabetic?***         Activities of Daily Living     No data to display           Patient Care Team: Teena Dunk, NP as PCP - General (Nurse Practitioner)  Indicate any recent Medical Services you may have received from other than [facility] providers in the past year (date may be approximate).     Assessment:   This is a routine wellness examination for Michelle Villarreal.  Hearing/Vision screen No results found.  Dietary issues and exercise activities discussed:     Goals Addressed   None   Depression Screen    10/25/2021    9:48 AM 04/26/2021   10:47 AM 10/21/2020   10:17 AM  PHQ 2/9 Scores  PHQ - 2 Score 4 0 2  PHQ- 9 Score 8  8    Fall Risk    02/21/2022   11:02 AM 10/25/2021    9:36 AM 04/26/2021   10:47 AM 10/24/2020    9:58 AM 10/21/2020   10:27 AM  Fall Risk   Falls in the past year? 1 1 0 0 0  Number falls in past yr: 0 1 0 0 0  Injury with Fall? 0 1 0 0 0  Comment  pt stated lt arm bruised     Risk for fall due to :     History of fall(s)  Follow up Falls evaluation completed Falls evaluation completed   Education provided    [place]:  Any stairs in or around the home? {YES/NO:21197} If so, are there any without handrails? {YES/NO:21197} Home free of loose throw rugs in walkways, pet beds, electrical cords, etc?  {YES/NO:21197} Adequate lighting in your home to reduce risk of falls? {YES/NO:21197}  ASSISTIVE DEVICES UTILIZED TO PREVENT FALLS:  Life alert? {YES/NO:21197} Use of a cane, walker or w/c? {YES/NO:21197} Grab bars in the bathroom? {YES/NO:21197} Shower chair or bench in shower? {YES/NO:21197} Elevated toilet  seat or a handicapped toilet? {YES/NO:21197}  TIMED UP AND GO:  Was the test performed? {YES/NO:21197}.  Length of time to ambulate 10 feet: *** sec.   {Appearance of ZOXW:9604540}  Cognitive Function:    10/21/2020   10:48 AM 02/17/2018    9:41 AM 12/28/2015   11:18 AM  MMSE - Mini Mental State Exam  Orientation to time _0 Orientation to Place _1 Registration _2 Attention/ Calculation _3 Recall _4 Language- name 2 objects _5 Language- repeat _6 Language- follow 3 step command _7 Language- read & follow direction _8 Write a sentence _9 Copy design _10 Total score _11 Immunizations Immunization History  Administered Date(s) Administered   Influenza Split 02/19/2013, 04/19/2014, 01/17/2015, 01/21/2016, 02/07/2017, 01/22/2018, 01/30/2019, 01/14/2020   Influenza, [place] Dose Seasonal PF 02/07/2017   Influenza-Unspecified 02/18/2021   PFIZER(Purple Top)SARS-COV-2 Vaccination 06/25/2019, 07/20/2019, 03/11/2020   Pneumococcal Conjugate-13 01/27/2014   Pneumococcal Polysaccharide-23 01/28/2013   Tdap 12/02/2017   Zoster Recombinat (Shingrix) 11/01/2021    {TDAP status:2101805}  {Flu Vaccine status:2101806}  {Pneumococcal vaccine status:2101807}  {Covid-19 vaccine status:2101808}  Qualifies for Shingles Vaccine? {YES/NO:21197}  Zostavax completed {YES/NO:21197}  {Shingrix Completed?:2101804}  Screening Tests Health Maintenance  Topic Date Due   Diabetic kidney evaluation - Urine ACR  Never done   Hepatitis C Screening  Never done   OPHTHALMOLOGY EXAM  10/12/2020   INFLUENZA VACCINE  12/12/2021    Zoster Vaccines- Shingrix (2 of 2) 12/27/2021   COVID-19 Vaccine (4 - 2023-24 season) 01/12/2022   Diabetic kidney evaluation - eGFR measurement  10/26/2022   FOOT EXAM  03/22/2023   Medicare Annual Wellness (AWV)  05/11/2023   DTaP/Tdap/Td (2 - Td or Tdap) 12/03/2027   COLONOSCOPY (Pts 45-17yr Insurance coverage will need to be confirmed)  05/25/2029   Pneumonia Vaccine 75 Years old  Completed   DEXA SCAN  Completed   HPV VACCINES  Aged Out   HEMOGLOBIN A1C  Discontinued    Health Maintenance  Health Maintenance Due  Topic Date Due   Diabetic kidney evaluation - Urine ACR  Never done   Hepatitis C Screening  Never done   OPHTHALMOLOGY EXAM  10/12/2020   INFLUENZA VACCINE  12/12/2021   Zoster Vaccines- Shingrix (2 of 2) 12/27/2021   COVID-19 Vaccine (4 - 2023-24 season) 01/12/2022    {Colorectal cancer screening:2101809}  {Mammogram status:21018020}  {Bone Density status:21018021}  Lung Cancer Screening: (Low Dose CT Chest recommended if Age 31-80 years, 30 pack-year currently smoking OR have quit w/in 15years.) {DOES NOT does:27190::"does not"} qualify.   Lung Cancer Screening Referral: ***  Additional Screening:  Hepatitis C Screening: {DOES NOT does:27190::"does not"} qualify; Completed ***  Vision Screening: Recommended annual ophthalmology exams for early detection of glaucoma and other disorders of the eye. Is the patient up to date with their annual eye exam?  {YES/NO:21197} Who is the provider or what is the name of the office in which the patient attends annual eye exams? *** If pt is not established with a provider, would they like to be referred to a provider to establish care? {YES/NO:21197}.   Dental Screening: Recommended annual dental exams for proper oral hygiene  Community Resource Referral / Chronic Care Management: CRR required this visit?  {YES/NO:21197}  CCM required this visit?  {  YES/NO:21197}     Plan:     I have personally reviewed and  noted the following in the patient's chart:   Medical and social history Use of alcohol, tobacco or illicit drugs  Current medications and supplements including opioid prescriptions. {Opioid [phone]} Functional ability and status Nutritional status Physical activity Advanced directives List of other physicians Hospitalizations, surgeries, and ER visits in previous 12 months Vitals Screenings to include cognitive, depression, and falls Referrals and appointments  In addition, I have reviewed and discussed with patient certain preventive protocols, quality metrics, and best practice recommendations. A written personalized care plan for preventive services as well as general preventive health recommendations were provided to patient.     Angus Seller, CMA   05/10/2022   Nurse Notes: ***

## 2022-05-16 NOTE — Progress Notes (Signed)
This encounter was created in error - please disregard.

## 2022-05-20 DIAGNOSIS — M129 Arthropathy, unspecified: Secondary | ICD-10-CM | POA: Diagnosis not present

## 2022-06-05 DIAGNOSIS — R6889 Other general symptoms and signs: Secondary | ICD-10-CM | POA: Diagnosis not present

## 2022-06-05 DIAGNOSIS — E1165 Type 2 diabetes mellitus with hyperglycemia: Secondary | ICD-10-CM | POA: Diagnosis not present

## 2022-06-20 ENCOUNTER — Encounter: Payer: Self-pay | Admitting: Nurse Practitioner

## 2022-06-20 ENCOUNTER — Ambulatory Visit: Payer: Medicare HMO | Admitting: Nurse Practitioner

## 2022-06-20 VITALS — BP 108/65 | HR 81 | Temp 98.4°F | Wt 237.6 lb

## 2022-06-20 DIAGNOSIS — R413 Other amnesia: Secondary | ICD-10-CM | POA: Diagnosis not present

## 2022-06-20 DIAGNOSIS — Z0000 Encounter for general adult medical examination without abnormal findings: Secondary | ICD-10-CM | POA: Diagnosis not present

## 2022-06-20 DIAGNOSIS — R42 Dizziness and giddiness: Secondary | ICD-10-CM

## 2022-06-20 DIAGNOSIS — F32A Depression, unspecified: Secondary | ICD-10-CM

## 2022-06-20 DIAGNOSIS — G8929 Other chronic pain: Secondary | ICD-10-CM

## 2022-06-20 DIAGNOSIS — K089 Disorder of teeth and supporting structures, unspecified: Secondary | ICD-10-CM

## 2022-06-20 DIAGNOSIS — M5442 Lumbago with sciatica, left side: Secondary | ICD-10-CM

## 2022-06-20 DIAGNOSIS — Z13220 Encounter for screening for lipoid disorders: Secondary | ICD-10-CM

## 2022-06-20 DIAGNOSIS — R6889 Other general symptoms and signs: Secondary | ICD-10-CM | POA: Diagnosis not present

## 2022-06-20 MED ORDER — MIRTAZAPINE 15 MG PO TBDP: 15.0000 mg | ORAL_TABLET | Freq: Every day | ORAL | 0 refills | Status: AC

## 2022-06-20 NOTE — Patient Instructions (Addendum)
  Michelle Villarreal , Thank you for taking time to come for your Medicare Wellness Visit. I appreciate your ongoing commitment to your health goals. Please review the following plan we discussed and let me know if I can assist you in the future.   These are the goals we discussed:  Goals      DIET - REDUCE FAT INTAKE     DIET - REDUCE FAT INTAKE        This is a list of the screening recommended for you and due dates:  Health Maintenance  Topic Date Due   Hepatitis C Screening: USPSTF Recommendation to screen - Ages 61-79 yo.  Never done   COVID-19 Vaccine (5 - 2023-24 season) 04/25/2022   Medicare Annual Wellness Visit  06/21/2023   DTaP/Tdap/Td vaccine (2 - Td or Tdap) 12/03/2027   Colon Cancer Screening  05/25/2029   Pneumonia Vaccine  Completed   Flu Shot  Completed   DEXA scan (bone density measurement)  Completed   Zoster (Shingles) Vaccine  Completed   HPV Vaccine  Aged Out    1. Chronic dental pain  - Ambulatory referral to Oral Maxillofacial Surgery  2. Chronic left-sided low back pain with left-sided sciatica  - Ambulatory referral to Pain Clinic  3. Memory loss  - Ambulatory referral to Neurology  4. Lightheaded  - Ambulatory referral to Neurology  5. Routine health maintenance  - CBC - Comprehensive metabolic panel  6. Lipid screening  - Lipid Panel  7. Depression, unspecified depression type  - mirtazapine (REMERON SOL-TAB) 15 MG disintegrating tablet; Take 1 tablet (15 mg total) by mouth at bedtime.  Dispense: 30 tablet; Refill: 0   Follow up:  Follow up in 6 months

## 2022-06-20 NOTE — Progress Notes (Signed)
Michelle Villarreal is a 77 y.o. female who presents for annual wellness visit and CPE and follow-up on chronic medical conditions.  She has the following concerns:  Immunizations and Health Maintenance Immunization History  Administered Date(s) Administered   Fluad Quad([place] Dose 65+) 03/31/2022   Influenza Split 02/19/2013, 04/19/2014, 01/17/2015, 01/21/2016, 02/07/2017, 01/22/2018, 01/30/2019, 01/14/2020   Influenza, [place] Dose Seasonal PF 02/07/2017   Influenza-Unspecified 02/18/2021   PFIZER Comirnaty(Gray Top)Covid-19 Tri-Sucrose Vaccine 02/28/2022   PFIZER(Purple Top)SARS-COV-2 Vaccination 06/25/2019, 07/20/2019, 03/11/2020   Pneumococcal Conjugate-13 01/27/2014   Pneumococcal Polysaccharide-23 01/28/2013   Tdap 12/02/2017   Zoster Recombinat (Shingrix) 08/01/2021, 11/01/2021   Health Maintenance Due  Topic Date Due   Hepatitis C Screening  Never done   Medicare Annual Wellness (AWV)  10/21/2021   COVID-19 Vaccine (5 - 2023-24 season) 04/25/2022     Last mammogram: na Last colonoscopy: up to date Last DEXA:completed Dentist:up to date      Depression screen:  See questionnaire below.     06/20/2022    9:34 AM 10/25/2021    9:48 AM 04/26/2021   10:47 AM 10/21/2020   10:17 AM  Depression screen PHQ 2/9  Decreased Interest 3 1 0 0  Down, Depressed, Hopeless 3 3 0 2  PHQ - 2 Score 6 4 0 2  Altered sleeping 3 3  0  Tired, decreased energy 1 0  0  Change in appetite 0 0  0  Feeling bad or failure about yourself  '1 1  2  '$ Trouble concentrating 1 0  2  Moving slowly or fidgety/restless 3 0  2  Suicidal thoughts 0 0  0  PHQ-9 Score '15 8  8  '$ Difficult doing work/chores Not difficult at all       Fall Risk Screen: see questionnaire below.    06/20/2022    9:29 AM 02/21/2022   11:02 AM 10/25/2021    9:36 AM 04/26/2021   10:47 AM 10/24/2020    9:58 AM  Fall Risk   Falls in the past year? '1 1 1 '$ 0 0  Number falls in past yr: 1 0 1 0 0  Injury with Fall? 0 0 1 0 0  Comment    pt stated lt arm bruised    Risk for fall due to : Orthopedic patient      Follow up Falls evaluation completed Falls evaluation completed Falls evaluation completed      ADL screen:  See questionnaire below Functional Status Survey: Is the patient deaf or have difficulty hearing?: No Does the patient have difficulty seeing, even when wearing glasses/contacts?: Yes Does the patient have difficulty concentrating, remembering, or making decisions?: Yes Does the patient have difficulty walking or climbing stairs?: Yes Does the patient have difficulty dressing or bathing?: No Does the patient have difficulty doing errands alone such as visiting a doctor's office or shopping?: No   Review of Systems Constitutional: -, -unexpected weight change, -anorexia, -fatigue Allergy: -sneezing, -itching, -congestion Dermatology: denies changing moles, rash, lumps ENT: -runny nose, -ear pain, -sore throat,  Cardiology:  -chest pain, -palpitations, -orthopnea, Respiratory: -cough, -shortness of breath, -dyspnea on exertion, -wheezing,  Gastroenterology: -abdominal pain, -nausea, -vomiting, -diarrhea, -constipation, -dysphagia Hematology: -bleeding or bruising problems Musculoskeletal: -arthralgias, -myalgias, -joint swelling, -back pain, - Ophthalmology: -vision changes,  Urology: -dysuria, -difficulty urinating,  -urinary frequency, -urgency, incontinence Neurology: -, -numbness, , -memory loss, -falls, -dizziness    PHYSICAL EXAM:  BP 108/65   Pulse 81   Temp 98.4 F (36.9 C)  Wt 237 lb 9.6 oz (107.8 kg)   SpO2 99%   BMI 34.09 kg/m   General Appearance: Alert, cooperative, no distress, appears stated age Head: Normocephalic, without obvious abnormality, atraumatic Eyes: PERRL, conjunctiva/corneas clear, EOM's intact, fundi benign Ears: Normal TM's and external ear canals Nose: Nares normal, mucosa normal, no drainage or sinus tenderness Throat: Lips, mucosa, and tongue normal; teeth  and gums normal Neck: Supple, no lymphadenopathy;  thyroid:  no enlargement/tenderness/nodules; no carotid bruit or JVD Lungs: Clear to auscultation bilaterally without wheezes, rales or ronchi; respirations unlabored Heart: Regular rate and rhythm, S1 and S2 normal, no murmur, rubor gallop Abdomen: Soft, non-tender, nondistended, normoactive bowel sounds,  no masses, no hepatosplenomegaly Extremities: No clubbing, cyanosis or edema Pulses: 2+ and symmetric all extremities Skin:  Skin color, texture, turgor normal, no rashes or lesions Lymph nodes: Cervical, supraclavicular, and axillary nodes normal Neurologic:  CNII-XII intact, normal strength, sensation and gait; reflexes 2+ and symmetric throughout Psych: Normal mood, affect, hygiene and grooming.  ASSESSMENT/PLAN:    Discussed monthly self breast exams and yearly mammograms; at least 30 minutes of aerobic activity at least 5 days/week and weight-bearing exercise 2x/week; proper sunscreen use reviewed; healthy diet, including goals of calcium and vitamin D intake and alcohol recommendations (less than or equal to 1 drink/day) reviewed; regular seatbelt use; changing batteries in smoke detectors.  Immunization recommendations discussed.  Colonoscopy recommendations reviewed   Medicare Attestation I have personally reviewed: The patient's medical and social history Their use of alcohol, tobacco or illicit drugs Their current medications and supplements The patient's functional ability including ADLs,fall risks, home safety risks, cognitive, and hearing and visual impairment Diet and physical activities Evidence for depression or mood disorders  The patient's weight, height, and BMI have been recorded in the chart.  I have made referrals, counseling, and provided education to the patient based on review of the above and I have provided the patient with a written personalized care plan for preventive services.    Patient Instructions    Ms. Jallow , Thank you for taking time to come for your Medicare Wellness Visit. I appreciate your ongoing commitment to your health goals. Please review the following plan we discussed and let me know if I can assist you in the future.   These are the goals we discussed:  Goals      DIET - REDUCE FAT INTAKE     DIET - REDUCE FAT INTAKE        This is a list of the screening recommended for you and due dates:  Health Maintenance  Topic Date Due   Hepatitis C Screening: USPSTF Recommendation to screen - Ages 30-79 yo.  Never done   COVID-19 Vaccine (5 - 2023-24 season) 04/25/2022   Medicare Annual Wellness Visit  06/21/2023   DTaP/Tdap/Td vaccine (2 - Td or Tdap) 12/03/2027   Colon Cancer Screening  05/25/2029   Pneumonia Vaccine  Completed   Flu Shot  Completed   DEXA scan (bone density measurement)  Completed   Zoster (Shingles) Vaccine  Completed   HPV Vaccine  Aged Out    1. Chronic dental pain  - Ambulatory referral to Oral Maxillofacial Surgery  2. Chronic left-sided low back pain with left-sided sciatica  - Ambulatory referral to Pain Clinic  3. Memory loss  - Ambulatory referral to Neurology  4. Lightheaded  - Ambulatory referral to Neurology  5. Routine health maintenance  - CBC - Comprehensive metabolic panel  6. Lipid screening  -  Lipid Panel  7. Depression, unspecified depression type  - mirtazapine (REMERON SOL-TAB) 15 MG disintegrating tablet; Take 1 tablet (15 mg total) by mouth at bedtime.  Dispense: 30 tablet; Refill: 0   Follow up:  Follow up in 6 months     Fenton Foy, NP   06/20/2022

## 2022-06-21 LAB — COMPREHENSIVE METABOLIC PANEL
ALT: 22 IU/L (ref 0–32)
AST: 27 IU/L (ref 0–40)
Albumin/Globulin Ratio: 1.7 (ref 1.2–2.2)
Albumin: 4.3 g/dL (ref 3.8–4.8)
Alkaline Phosphatase: 68 IU/L (ref 44–121)
BUN/Creatinine Ratio: 25 (ref 12–28)
BUN: 20 mg/dL (ref 8–27)
Bilirubin Total: 0.4 mg/dL (ref 0.0–1.2)
CO2: 25 mmol/L (ref 20–29)
Calcium: 9.7 mg/dL (ref 8.7–10.3)
Chloride: 101 mmol/L (ref 96–106)
Creatinine, Ser: 0.8 mg/dL (ref 0.57–1.00)
Globulin, Total: 2.6 g/dL (ref 1.5–4.5)
Glucose: 91 mg/dL (ref 70–99)
Potassium: 4.1 mmol/L (ref 3.5–5.2)
Sodium: 141 mmol/L (ref 134–144)
Total Protein: 6.9 g/dL (ref 6.0–8.5)
eGFR: 77 mL/min/{1.73_m2} (ref >59)

## 2022-06-21 LAB — LIPID PANEL
Chol/HDL Ratio: 2.8 ratio (ref 0.0–4.4)
Cholesterol, Total: 208 mg/dL — ABNORMAL HIGH (ref 100–199)
HDL: 74 mg/dL (ref >39)
LDL Chol Calc (NIH): 127 mg/dL — ABNORMAL HIGH (ref 0–99)
Triglycerides: 39 mg/dL (ref 0–149)
VLDL Cholesterol Cal: 7 mg/dL (ref 5–40)

## 2022-06-21 LAB — CBC
Hematocrit: 38.6 % (ref 34.0–46.6)
Hemoglobin: 12.8 g/dL (ref 11.1–15.9)
MCH: 31.4 pg (ref 26.6–33.0)
MCHC: 33.2 g/dL (ref 31.5–35.7)
MCV: 95 fL (ref 79–97)
Platelets: 148 10*3/uL — ABNORMAL LOW (ref 150–450)
RBC: 4.07 x10E6/uL (ref 3.77–5.28)
RDW: 13.2 % (ref 11.7–15.4)
WBC: 4.7 10*3/uL (ref 3.4–10.8)

## 2022-06-22 ENCOUNTER — Other Ambulatory Visit: Payer: Self-pay

## 2022-06-22 DIAGNOSIS — I10 Essential (primary) hypertension: Secondary | ICD-10-CM

## 2022-06-22 MED ORDER — OLMESARTAN MEDOXOMIL-HCTZ 20-12.5 MG PO TABS: 1.0000 | ORAL_TABLET | Freq: Every day | ORAL | 0 refills | Status: DC

## 2022-06-25 ENCOUNTER — Other Ambulatory Visit: Payer: Self-pay

## 2022-06-28 ENCOUNTER — Encounter: Payer: Self-pay | Admitting: Physician Assistant

## 2022-08-02 ENCOUNTER — Ambulatory Visit: Payer: Medicare HMO | Admitting: Physician Assistant

## 2022-09-18 ENCOUNTER — Other Ambulatory Visit: Payer: Self-pay | Admitting: Nurse Practitioner

## 2022-09-18 DIAGNOSIS — I10 Essential (primary) hypertension: Secondary | ICD-10-CM

## 2022-10-12 ENCOUNTER — Ambulatory Visit: Payer: Medicare Other | Admitting: Physician Assistant

## 2022-12-20 ENCOUNTER — Ambulatory Visit (INDEPENDENT_AMBULATORY_CARE_PROVIDER_SITE_OTHER): Payer: Medicare Other | Admitting: Nurse Practitioner

## 2022-12-20 ENCOUNTER — Encounter: Payer: Self-pay | Admitting: Nurse Practitioner

## 2022-12-20 VITALS — BP 121/80 | HR 88 | Temp 97.2°F | Wt 236.0 lb

## 2022-12-20 DIAGNOSIS — R413 Other amnesia: Secondary | ICD-10-CM | POA: Diagnosis not present

## 2022-12-20 DIAGNOSIS — I10 Essential (primary) hypertension: Secondary | ICD-10-CM

## 2022-12-20 DIAGNOSIS — G629 Polyneuropathy, unspecified: Secondary | ICD-10-CM | POA: Diagnosis not present

## 2022-12-20 NOTE — Progress Notes (Signed)
@Patient  ID: Michelle Villarreal, female    DOB: February 18, 1946, 77 y.o.   MRN: [id number]  Chief Complaint  Patient presents with   Follow-up    Referring provider: Kathrynn Speed, NP   HPI  Michelle Villarreal Spells 77 y.o. female  has a past medical history of Adenoma, Bladder incontinence, Cataracts, bilateral, Chronic pain, Fall, Fibromyalgia, Hypertension, Neuropathy, Osteoarthritis, and Tinnitus.    Chronic bilateral knee pain stable. She does have some leg swelling. Needs referral back neurology. Has been seen there before for neuropathy and memory loss.  Patient continues to complain about ongoing memory loss.  We will place referral to neurology today. Denies f/c/s, n/v/d, hemoptysis, PND, leg swelling Denies chest pain or edema       Allergies  Allergen Reactions   Ultram [Tramadol] Shortness Of Breath   Acetazolamide     Other reaction(s): severe headaches   Benzodiazepines Other (See Comments)    Short term memory loss   Codeine Itching    Can take with benadryl in certain time length   Demerol [Meperidine] Nausea Only   Duloxetine Hcl     Other reaction(s): memory loss short term   Fentanyl     Other reaction(s): itching   Fondaparinux Other (See Comments)    Increased bleeding Other reaction(s): increase bleeding potential   Gabapentin Other (See Comments)    Memory loss   Lyrica [Pregabalin] Other (See Comments)    Memory loss   Morphine And Codeine Hives   Oxycodone-Acetaminophen     Other reaction(s): itching   Pentazocine     Other reaction(s): Itching   Zoloft [Sertraline Hcl] Other (See Comments)    Memory loss    Immunization History  Administered Date(s) Administered   Fluad Quad([place] Dose 65+) 03/31/2022   Influenza Split 02/19/2013, 04/19/2014, 01/17/2015, 01/21/2016, 02/07/2017, 01/22/2018, 01/30/2019, 01/14/2020   Influenza, [place] Dose Seasonal PF 02/07/2017   Influenza-Unspecified 02/18/2021   PFIZER Comirnaty(Gray Top)Covid-19 Tri-Sucrose Vaccine  02/28/2022   PFIZER(Purple Top)SARS-COV-2 Vaccination 06/25/2019, 07/20/2019, 03/11/2020   Pneumococcal Conjugate-13 01/27/2014   Pneumococcal Polysaccharide-23 01/28/2013   Tdap 12/02/2017   Zoster Recombinant(Shingrix) 08/01/2021, 11/01/2021    Past Medical History:  Diagnosis Date   Adenoma    adrenal gland   Bladder incontinence    Cataracts, bilateral    Chronic pain    Fall    09/30/13, 03/15/15, 06/14/15   Fibromyalgia    Hypertension    Neuropathy    Osteoarthritis    Tinnitus     Tobacco History: Social History   Tobacco Use  Smoking Status Former   Types: Cigarettes  Smokeless Tobacco Never   Counseling given: Not Answered   Outpatient Encounter Medications as of 12/20/2022  Medication Sig   Acetaminophen (TYLENOL 8 HOUR PO) Take by mouth.   aspirin 81 MG chewable tablet Chew by mouth as needed.   Biotin 1000 MCG CHEW Chew by mouth.   CALCIUM PO Take by mouth.   Camphor-Menthol-Methyl Sal (SALONPAS EX) Apply topically.   Carboxymethylcellulose Sodium (DRY EYE RELIEF OP) Apply to eye.   Chlorpheniramine Maleate (ALLERGY PO) Take by mouth.   Cholecalciferol (VITAMIN D3 PO) Take 800 Units by mouth 3 (three) times a week.   Cranberry-Vitamin C-Probiotic (AZO CRANBERRY PO) Take by mouth.   diclofenac Sodium (VOLTAREN) 1 % GEL Apply topically 4 (four) times daily.   diphenhydrAMINE HCl (BENADRYL ALLERGY PO) Take by mouth.   lidocaine (LMX) 4 % cream Apply 1 Application topically as needed.   Liniments (SALONPAS  PAIN RELIEF PATCH EX) Apply topically daily as needed.   MAGNESIUM PO Take 200 mg by mouth.   Methen-Hyosc-Meth Blue-Na Phos (UROGESIC-BLUE PO) Take 1 tablet by mouth 4 (four) times daily as needed.   naproxen sodium (ANAPROX) 220 MG tablet Take 220 mg by mouth every 8 (eight) hours as needed. AS NEEDED   olmesartan-hydrochlorothiazide (BENICAR HCT) 20-12.5 MG tablet Take 1 tablet by mouth once daily   Polyethyl Glycol-Propyl Glycol (SYSTANE OP) Apply to  eye.   vitamin E 400 UNIT capsule Take 400-800 Units by mouth. 3-4 times weekly per pt   zinc gluconate 50 MG tablet Take 50 mg by mouth daily.   guaiFENesin (MUCINEX) 600 MG 12 hr tablet Take by mouth 2 (two) times daily.   Magnesium Hydroxide (DULCOLAX PO) Take by mouth.   mirtazapine (REMERON SOL-TAB) 15 MG disintegrating tablet Take 1 tablet (15 mg total) by mouth at bedtime.   No facility-administered encounter medications on file as of 12/20/2022.     Review of Systems  Review of Systems  Constitutional: Negative.   HENT: Negative.    Cardiovascular: Negative.   Gastrointestinal: Negative.   Allergic/Immunologic: Negative.   Neurological: Negative.   Psychiatric/Behavioral: Negative.         Physical Exam  BP 121/80   Pulse 88   Temp (!) 97.2 F (36.2 C)   Wt 236 lb (107 kg)   SpO2 99%   BMI 33.86 kg/m   Wt Readings from Last 5 Encounters:  12/20/22 236 lb (107 kg)  06/20/22 237 lb 9.6 oz (107.8 kg)  02/21/22 234 lb 3.2 oz (106.2 kg)  10/25/21 237 lb 6 oz (107.7 kg)  04/26/21 234 lb (106.1 kg)     Physical Exam Vitals and nursing note reviewed.  Constitutional:      General: She is not in acute distress.    Appearance: She is well-developed.  Cardiovascular:     Rate and Rhythm: Normal rate and regular rhythm.  Pulmonary:     Effort: Pulmonary effort is normal.     Breath sounds: Normal breath sounds.  Neurological:     Mental Status: She is alert and oriented to person, place, and time.      Lab Results:  CBC    Component Value Date/Time   WBC 4.7 06/20/2022 1038   WBC 6.5 05/06/2017 1051   RBC 4.07 06/20/2022 1038   RBC 4.46 05/06/2017 1051   HGB 12.8 06/20/2022 1038   HCT 38.6 06/20/2022 1038   PLT 148 (L) 06/20/2022 1038   MCV 95 06/20/2022 1038   MCH 31.4 06/20/2022 1038   MCH 30.9 05/06/2017 1051   MCHC 33.2 06/20/2022 1038   MCHC 32.5 05/06/2017 1051   RDW 13.2 06/20/2022 1038   LYMPHSABS 1.1 10/25/2021 1041   MONOABS 0.4  05/06/2017 1051   EOSABS 0.3 10/25/2021 1041   BASOSABS 0.0 10/25/2021 1041    BMET    Component Value Date/Time   NA 141 06/20/2022 1038   K 4.1 06/20/2022 1038   CL 101 06/20/2022 1038   CO2 25 06/20/2022 1038   GLUCOSE 91 06/20/2022 1038   GLUCOSE 104 (H) 05/06/2017 1051   BUN 20 06/20/2022 1038   CREATININE 0.80 06/20/2022 1038   CALCIUM 9.7 06/20/2022 1038   GFRNONAA >60 05/06/2017 1051   GFRAA >60 05/06/2017 1051      Assessment & Plan:   Neuropathy - Ambulatory referral to Neurology   2. Memory loss  - Ambulatory referral to Neurology  3. Essential hypertension  - CBC - Comprehensive metabolic panel   Follow up:  Follow up in 6 months     Ivonne Andrew, NP 12/21/2022

## 2022-12-20 NOTE — Patient Instructions (Addendum)
1. Neuropathy  - Ambulatory referral to Neurology   2. Memory loss  - Ambulatory referral to Neurology   3. Essential hypertension  - CBC - Comprehensive metabolic panel   Follow up:  Follow up in 6 months

## 2022-12-21 ENCOUNTER — Encounter: Payer: Self-pay | Admitting: Nurse Practitioner

## 2022-12-21 DIAGNOSIS — G629 Polyneuropathy, unspecified: Secondary | ICD-10-CM | POA: Insufficient documentation

## 2022-12-21 NOTE — Assessment & Plan Note (Signed)
-   Ambulatory referral to Neurology   2. Memory loss  - Ambulatory referral to Neurology   3. Essential hypertension  - CBC - Comprehensive metabolic panel   Follow up:  Follow up in 6 months

## 2022-12-22 ENCOUNTER — Other Ambulatory Visit: Payer: Self-pay | Admitting: Nurse Practitioner

## 2022-12-22 DIAGNOSIS — I10 Essential (primary) hypertension: Secondary | ICD-10-CM

## 2022-12-24 ENCOUNTER — Telehealth: Payer: Self-pay

## 2022-12-24 NOTE — Telephone Encounter (Signed)
Patient is asking for a referral back to [facility]. She hasn't seen them in 2-3 years. She has a mass on her left adrenal gland that was benign but has grown. She needs to see them again for it to be reevaluate.

## 2022-12-26 ENCOUNTER — Other Ambulatory Visit: Payer: Self-pay | Admitting: Nurse Practitioner

## 2022-12-26 DIAGNOSIS — E278 Other specified disorders of adrenal gland: Secondary | ICD-10-CM

## 2022-12-27 ENCOUNTER — Other Ambulatory Visit: Payer: Self-pay | Admitting: Nurse Practitioner

## 2022-12-27 DIAGNOSIS — E278 Other specified disorders of adrenal gland: Secondary | ICD-10-CM

## 2023-02-18 ENCOUNTER — Other Ambulatory Visit: Payer: Self-pay | Admitting: Nurse Practitioner

## 2023-02-18 DIAGNOSIS — E278 Other specified disorders of adrenal gland: Secondary | ICD-10-CM

## 2023-03-12 ENCOUNTER — Emergency Department (HOSPITAL_COMMUNITY): Payer: Medicare Other

## 2023-03-12 ENCOUNTER — Emergency Department (HOSPITAL_COMMUNITY)
Admission: EM | Admit: 2023-03-12 | Discharge: 2023-03-12 | Disposition: A | Discharge status: Home / Self Care | Payer: Medicare Other | Attending: Emergency Medicine | Admitting: Emergency Medicine

## 2023-03-12 ENCOUNTER — Encounter (HOSPITAL_COMMUNITY): Payer: Self-pay

## 2023-03-12 ENCOUNTER — Other Ambulatory Visit: Payer: Self-pay

## 2023-03-12 DIAGNOSIS — Z7982 Long term (current) use of aspirin: Secondary | ICD-10-CM | POA: Diagnosis not present

## 2023-03-12 DIAGNOSIS — R519 Headache, unspecified: Secondary | ICD-10-CM | POA: Diagnosis not present

## 2023-03-12 DIAGNOSIS — R0789 Other chest pain: Secondary | ICD-10-CM | POA: Diagnosis present

## 2023-03-12 DIAGNOSIS — Z1152 Encounter for screening for COVID-19: Secondary | ICD-10-CM | POA: Diagnosis not present

## 2023-03-12 DIAGNOSIS — Z79899 Other long term (current) drug therapy: Secondary | ICD-10-CM | POA: Insufficient documentation

## 2023-03-12 DIAGNOSIS — R0602 Shortness of breath: Secondary | ICD-10-CM | POA: Diagnosis not present

## 2023-03-12 DIAGNOSIS — R000 Tachycardia, unspecified: Secondary | ICD-10-CM | POA: Diagnosis not present

## 2023-03-12 LAB — CBC WITH DIFFERENTIAL/PLATELET
Abs Immature Granulocytes: 0.01 10*3/uL (ref 0.00–0.07)
Basophils Absolute: 0 10*3/uL (ref 0.0–0.1)
Basophils Relative: 1 %
Eosinophils Absolute: 0.2 10*3/uL (ref 0.0–0.5)
Eosinophils Relative: 4 %
HCT: 38.5 % (ref 36.0–46.0)
Hemoglobin: 12.6 g/dL (ref 12.0–15.0)
Immature Granulocytes: 0 %
Lymphocytes Relative: 19 %
Lymphs Abs: 1.1 10*3/uL (ref 0.7–4.0)
MCH: 31.3 pg (ref 26.0–34.0)
MCHC: 32.7 g/dL (ref 30.0–36.0)
MCV: 95.5 fL (ref 80.0–100.0)
Monocytes Absolute: 0.6 10*3/uL (ref 0.1–1.0)
Monocytes Relative: 12 %
Neutro Abs: 3.6 10*3/uL (ref 1.7–7.7)
Neutrophils Relative %: 64 %
Platelets: 147 10*3/uL — ABNORMAL LOW (ref 150–400)
RBC: 4.03 MIL/uL (ref 3.87–5.11)
RDW: 13.9 % (ref 11.5–15.5)
WBC: 5.5 10*3/uL (ref 4.0–10.5)
nRBC: 0 % (ref 0.0–0.2)

## 2023-03-12 LAB — COMPREHENSIVE METABOLIC PANEL
ALT: 21 U/L (ref 0–44)
AST: 25 U/L (ref 15–41)
Albumin: 3.5 g/dL (ref 3.5–5.0)
Alkaline Phosphatase: 52 U/L (ref 38–126)
Anion gap: 8 (ref 5–15)
BUN: 11 mg/dL (ref 8–23)
CO2: 26 mmol/L (ref 22–32)
Calcium: 9.2 mg/dL (ref 8.9–10.3)
Chloride: 108 mmol/L (ref 98–111)
Creatinine, Ser: 0.76 mg/dL (ref 0.44–1.00)
GFR, Estimated: >60 mL/min (ref >60)
Glucose, Bld: 93 mg/dL (ref 70–99)
Potassium: 3.7 mmol/L (ref 3.5–5.1)
Sodium: 142 mmol/L (ref 135–145)
Total Bilirubin: 0.6 mg/dL (ref 0.3–1.2)
Total Protein: 6.2 g/dL — ABNORMAL LOW (ref 6.5–8.1)

## 2023-03-12 LAB — MAGNESIUM: Magnesium: 1.9 mg/dL (ref 1.7–2.4)

## 2023-03-12 LAB — SARS CORONAVIRUS 2 BY RT PCR: SARS Coronavirus 2 by RT PCR: NEGATIVE

## 2023-03-12 MED ORDER — SODIUM CHLORIDE 0.9 % IV BOLUS
1000.0000 mL | Freq: Once | INTRAVENOUS | Status: AC
  Administered 2023-03-12: 1000 mL via INTRAVENOUS

## 2023-03-12 MED ORDER — ONDANSETRON HCL 4 MG/2ML IJ SOLN
4.0000 mg | Freq: Once | INTRAMUSCULAR | Status: AC
  Administered 2023-03-12: 4 mg via INTRAVENOUS
  Filled 2023-03-12: qty 2

## 2023-03-12 MED ORDER — ACETAMINOPHEN 500 MG PO TABS
1000.0000 mg | ORAL_TABLET | Freq: Once | ORAL | Status: AC
  Administered 2023-03-12: 1000 mg via ORAL
  Filled 2023-03-12: qty 2

## 2023-03-12 NOTE — ED Provider Notes (Signed)
[place] EMERGENCY DEPARTMENT AT [facility] Provider Note   CSN: [id number] Arrival date & time: 03/12/23  1542     History  Chief Complaint  Patient presents with   SVT   SOB    Michelle Villarreal is a 77 y.o. female.  77 yo F with a chief complaints of sudden sensation like she was having trouble breathing and chest pressure.  Patient had walked to the grocery store and when she walked back she suddenly developed the symptoms.  She called EMS and was found to have a heart rate in the 160s.  Thought to be in SVT she was given adenosine and had resolution of her symptoms and her heart rate came down to the low 100s.  She felt she has been doing well otherwise.  She had a bit similar about 3 months ago.  Otherwise denied any chest pain denied cough congestion or fever denied abdominal pain denied nausea vomiting or diarrhea.  She denies any recent medication changes.  She has had a visit with her PCP in the interim and had reportedly a unremarkable laboratory evaluation.        Home Medications Prior to Admission medications   Medication Sig Start Date End Date Taking? Authorizing Provider  Acetaminophen (TYLENOL 8 HOUR PO) Take by mouth.    [provider]  aspirin 81 MG chewable tablet Chew by mouth as needed.    [provider]  Biotin 1000 MCG CHEW Chew by mouth.    [provider]  CALCIUM PO Take by mouth.    [provider]  Camphor-Menthol-Methyl Sal (SALONPAS EX) Apply topically.    [provider]  Carboxymethylcellulose Sodium (DRY EYE RELIEF OP) Apply to eye.    [provider]  Chlorpheniramine Maleate (ALLERGY PO) Take by mouth.    [provider]  Cholecalciferol (VITAMIN D3 PO) Take 800 Units by mouth 3 (three) times a week.    [provider]  Cranberry-Vitamin C-Probiotic (AZO CRANBERRY PO) Take by mouth.    [provider]  diclofenac Sodium (VOLTAREN) 1 % GEL Apply  topically 4 (four) times daily.    [provider]  diphenhydrAMINE HCl (BENADRYL ALLERGY PO) Take by mouth.    [provider]  guaiFENesin (MUCINEX) 600 MG 12 hr tablet Take by mouth 2 (two) times daily.    [provider]  lidocaine (LMX) 4 % cream Apply 1 Application topically as needed.    [provider]  Liniments (SALONPAS PAIN RELIEF PATCH EX) Apply topically daily as needed.    [provider]  Magnesium Hydroxide (DULCOLAX PO) Take by mouth.    [provider]  MAGNESIUM PO Take 200 mg by mouth.    [provider]  Methen-Hyosc-Meth Blue-Na Phos (UROGESIC-BLUE PO) Take 1 tablet by mouth 4 (four) times daily as needed.    [provider]  mirtazapine (REMERON SOL-TAB) 15 MG disintegrating tablet Take 1 tablet (15 mg total) by mouth at bedtime. 06/20/22 07/20/22  Ivonne Andrew, NP  naproxen sodium (ANAPROX) 220 MG tablet Take 220 mg by mouth every 8 (eight) hours as needed. AS NEEDED    [provider]  olmesartan-hydrochlorothiazide (BENICAR HCT) 20-12.5 MG tablet Take 1 tablet by mouth once daily 12/24/22   Ivonne Andrew, NP  Polyethyl Glycol-Propyl Glycol (SYSTANE OP) Apply to eye.    [provider]  vitamin E 400 UNIT capsule Take 400-800 Units by mouth. 3-4 times weekly per pt  [provider]  zinc gluconate 50 MG tablet Take 50 mg by mouth daily.    [provider]      Allergies    Ultram [tramadol], Acetazolamide, Benzodiazepines, Codeine, Demerol [meperidine], Duloxetine hcl, Fentanyl, Fondaparinux, Gabapentin, Lyrica [pregabalin], Morphine and codeine, Oxycodone-acetaminophen, Pentazocine, and Zoloft [sertraline hcl]    Review of Systems   Review of Systems  Physical Exam Updated Vital Signs BP (!) 149/86   Pulse 87   Temp 98.4 F (36.9 C) (Oral)   Resp 18   Ht 5\' 10"  (1.778 m)   Wt 106.6 kg   SpO2 100%   BMI 33.72 kg/m  Physical Exam Vitals and  nursing note reviewed.  Constitutional:      General: She is not in acute distress.    Appearance: She is well-developed. She is not diaphoretic.  HENT:     Head: Normocephalic and atraumatic.  Eyes:     Pupils: Pupils are equal, round, and reactive to light.  Cardiovascular:     Rate and Rhythm: Normal rate and regular rhythm.     Heart sounds: No murmur heard.    No friction rub. No gallop.  Pulmonary:     Effort: Pulmonary effort is normal.     Breath sounds: No wheezing or rales.  Abdominal:     General: There is no distension.     Palpations: Abdomen is soft.     Tenderness: There is no abdominal tenderness.  Musculoskeletal:        General: No tenderness.     Cervical back: Normal range of motion and neck supple.  Skin:    General: Skin is warm and dry.  Neurological:     Mental Status: She is alert and oriented to person, place, and time.  Psychiatric:        Behavior: Behavior normal.     ED Results / Procedures / Treatments   Labs (all labs ordered are listed, but only abnormal results are displayed) Labs Reviewed  CBC WITH DIFFERENTIAL/PLATELET - Abnormal; Notable for the following components:      Result Value   Platelets 147 (*)    All other components within normal limits  COMPREHENSIVE METABOLIC PANEL - Abnormal; Notable for the following components:   Total Protein 6.2 (*)    All other components within normal limits  SARS CORONAVIRUS 2 BY RT PCR  MAGNESIUM    EKG EKG Interpretation Date/Time:  Tuesday March 12 2023 15:53:32 EDT Ventricular Rate:  102 PR Interval:  142 QRS Duration:  126 QT Interval:  372 QTC Calculation: 485 R Axis:   82  Text Interpretation: Sinus tachycardia Left atrial enlargement Right bundle branch block No significant change since last tracing Confirmed by Melene Plan [phone]) on 03/12/2023 4:38:09 PM  Radiology No results found.  Procedures .1-3 Lead EKG Interpretation  Performed by: Melene Plan, DO Authorized by:  Melene Plan, DO     Interpretation: normal     ECG rate:  85   ECG rate assessment: normal     Rhythm: sinus rhythm     Ectopy: none     Conduction: normal       Medications Ordered in ED Medications  sodium chloride 0.9 % bolus 1,000 mL (0 mLs Intravenous Stopped 03/12/23 1921)  acetaminophen (TYLENOL) tablet 1,000 mg (1,000 mg Oral Given 03/12/23 1709)  ondansetron (ZOFRAN) injection 4 mg (4 mg Intravenous Given 03/12/23 1709)    ED Course/ Medical Decision Making/ A&P  Medical Decision Making Amount and/or Complexity of Data Reviewed Labs: ordered. Radiology: ordered.  Risk OTC drugs. Prescription drug management.   77 yo F with a chief complaints of.  Where she had chest pain and difficulty breathing.  This seemed to coincide with a heart rate in the 160s.  Was given adenosine and converted.  Heart rate in the low 100s here.  Asymptomatic currently.  Obtain a laboratory evaluation to assess for electrolyte derangement.  Reassess.  7:57 PM I was notified that the patient was developing a bit of a headache.  She tells me that her head hurts kind of all over.  Started while she was sitting here.  Had no headaches prior to arrival today.  She did have a headache she thinks that the onset of her heart racing.  Patient's heart rate has improved.  I am not sure of the cause of her headache.  I wonder if this is a started a viral syndrome.  Will obtain a COVID test.  COVID-negative, no significant electrolyte abnormalities, no significant anemia.  Chest x-ray independently interpreted by me without focal pneumothorax.  Patient had been observed here in the ED for a few hours.  No return of arrhythmia.  She would like to go home at this time.  PCP follow-up.  7:57 PM:  I have discussed the diagnosis/risks/treatment options with the patient and family.  Evaluation and diagnostic testing in the emergency department does not suggest an emergent condition  requiring admission or immediate intervention beyond what has been performed at this time.  They will follow up with PCP. We also discussed returning to the ED immediately if new or worsening sx occur. We discussed the sx which are most concerning (e.g., sudden worsening pain, fever, inability to tolerate by mouth) that necessitate immediate return. Medications administered to the patient during their visit and any new prescriptions provided to the patient are listed below.  Medications given during this visit Medications  sodium chloride 0.9 % bolus 1,000 mL (0 mLs Intravenous Stopped 03/12/23 1921)  acetaminophen (TYLENOL) tablet 1,000 mg (1,000 mg Oral Given 03/12/23 1709)  ondansetron (ZOFRAN) injection 4 mg (4 mg Intravenous Given 03/12/23 1709)     The patient appears reasonably screen and/or stabilized for discharge and I doubt any other medical condition or other [facility] requiring further screening, evaluation, or treatment in the ED at this time prior to discharge.         Final Clinical Impression(s) / ED Diagnoses Final diagnoses:  Tachycardia    Rx / DC Orders ED Discharge Orders     None         Melene Plan, DO 03/12/23 4010

## 2023-03-12 NOTE — Discharge Instructions (Signed)
Follow up with your family doc in the office.  Return for worsening or persistent symptoms.  Try to eat and drink as well as you can for the next couple days.

## 2023-03-12 NOTE — ED Triage Notes (Signed)
Pt BIB [facility] from home d/t SVT & feeling SOB today. EMS reports she was walking out of her home to meet them on scene. Pt reports she was SOB & her BP was 84/60, EMS reports she was SVT on their monitor at rate of 168 bpm & her BP for them was 116/66. She denies any cardiac Hx, EMS gave 6 mg Adenosine in 20g Rt AC PIV & her rate then went down to 100 bpm. A/Ox4, denies any other pain.

## 2023-04-15 ENCOUNTER — Ambulatory Visit: Payer: Medicare Other | Admitting: Neurology

## 2023-04-15 ENCOUNTER — Encounter: Payer: Self-pay | Admitting: Neurology

## 2023-04-15 ENCOUNTER — Telehealth: Payer: Self-pay | Admitting: Neurology

## 2023-04-15 VITALS — BP 142/88 | HR 86 | Ht 70.0 in | Wt 239.0 lb

## 2023-04-15 DIAGNOSIS — G301 Alzheimer's disease with late onset: Secondary | ICD-10-CM

## 2023-04-15 DIAGNOSIS — R4189 Other symptoms and signs involving cognitive functions and awareness: Secondary | ICD-10-CM

## 2023-04-15 DIAGNOSIS — G309 Alzheimer's disease, unspecified: Secondary | ICD-10-CM

## 2023-04-15 DIAGNOSIS — G3184 Mild cognitive impairment, so stated: Secondary | ICD-10-CM | POA: Diagnosis not present

## 2023-04-15 DIAGNOSIS — R413 Other amnesia: Secondary | ICD-10-CM

## 2023-04-15 NOTE — Telephone Encounter (Signed)
[employer] medicare NPR sent to GI [phone] 

## 2023-04-15 NOTE — Progress Notes (Signed)
[facility]    Provider:  Dr Lucia Gaskins Referring Provider: Ivonne Andrew, NP Primary Care Physician:  Ivonne Andrew, NP  CC:  Memory loss  HPI 04/15/2023:  77 year old with a past medical history of hypertension, fall, chronic pain, osteoarthritis, neuropathy, diabetes, cataracts fibromyalgia. She is back again for memory loss, she was seen in 2017 and we agree on an FDG PET Scan and we had it approved and she decided she did not want to continue. She feels her memory continues to progressively worsen. She can't remember words and then they come to her in a few minutes. MMSE 30/30 2022 years ago and 30/30. She has chronic low back pain and is on multiple medications. She also has no car and sits at home all day bored. She lives with her daughter and disabled grandson and it stresses her because her daughter won't take her grandson to be evaluated. No FHx of dementia but mother and father died young.daughter provides much information. Paient's daughter is here and provides much information, daughter thinks she is looking for attention. She is not sure about all her symptoms. Patient goes on about the neuropathy, she is having a problem with technology she states yet she can retrieve deleted messages and she can write long tects. She has long-standing neuropathy and complains but she takes her medicine and she is fine. States she wants to driven around. Patient's daughter provides much information. Patient c/o same symptoms yet her MMSE remains the same. She is difficult to redirect today and came late.   Patient complains of symptoms per HPI as well as the following symptoms: cognitive decline, pain, neuropathy . Pertinent negatives and positives per HPI. All others negative  MRI brain 2017  Narrative & Impression    [facility] [address] [place], [place] [address] [phone]   NEUROIMAGING REPORT     STUDY DATE: 08/11/2015 PATIENT  NAME: Michelle Villarreal DOB: [birth year] MRN: [id number]   ORDERING CLINICIAN: Dr Lucia Gaskins CLINICAL HISTORY:  55 year patient with right ear pulsatille tinnitus COMPARISON FILMS: CT Head and CTA head 07/28/15 EXAM: MRI Brain w/wo TECHNIQUE: MRI of the brain with and without contrast was obtained utilizing 5 mm axial slices with T1, T2, T2 flair, T2 star gradient echo and diffusion weighted views.  T1 sagittal, T2 coronal and postcontrast views in the axial and coronal plane were obtained. CONTRAST: 20 ml iv multihance IMAGING SITE: [facility]   FINDINGS:  The brain parenchyma shows few scattered periventricular and subcortical nonspecific white matter hyperintensities likely due to small vessel disease. No structural lesion, tumor infarcts are noted. The subarachnoid spaces and ventricular system appear normal. Diffusion-weighted imaging shows no acute ischemia. Gradient echo images show no microhemorrhages. Orbits appear unremarkable paranasal sinuses show only minor mucosal thickening. Calvarium shows no abnormality. Pituitary gland and cerebellar tonsillar pain normal. Visualized structures of both inner ears appear normal without vascular or structural abnormalities. Flow-voids of the large vessels of the intracranial circulation appear to be patent. Visualized portion of the upper cervical spine and cranial vertebral junction appears unremarkable. Postcontrast images do not result in abnormal areas of enhancement.         IMPRESSION:  Slightly abnormal MRI scan of the brain showing mild changes of chronic microvascular ischemia. No structural lesions are noted.       INTERPRETING PHYSICIAN:  Delia Heady, MD Certified in  Neuroimaging by American Society of Neuroimaging and [employer] Neu[employer]specialities  Interval history 12/28/2015: Patient is here for a new problem, memory loss for 4-5 months. Patient is having changes in short-term memory. She Is forgetting things.  When she had nerve blocks with me she couldn't remember signing the consent and came back for a copy. Can't rememebr how to spell simple words. Using the wrong words in texts and leaving words off. Has left the stove on 3x since being in here. She forgot her pin. She stopped her blood pressure medications and the symptoms of her right ear improved. No Fhx of alzheimers however parents died young. Symptoms started around November of last year with noise in her ear. Progressively worsening. She is very tired during the day. She has multiple medical problems per patient such as hip and bacl pain and knee paon. Shs is under a lot of stress. She has significant pain in her hips. She is anxious, she is worried that she has early-onset dementia and no one will take care of her. She hs neuropathy which hurts her in bed, wrse at night, not sleeping well, hip and back pain. Progressive memory changes. Confusion.   MRI of the brain 07/2015:  Slightly abnormal MRI scan of the brain showing mild changes of chronic microvascular ischemia. No structural lesions are noted.   Interval history 08/09/2015: She is still having pain in the right side of the posterior head. She has pounding, throbbing, severe. It is radiating from the right occipital area, tender, and into the temple. The pain has been continuous. Worse when she wakes up in the morning. Discussed the pain could be occipital neuralgia. Showed patient images of the occipital nerve. We will order an MRI of the brain. The CTA of the head was unremarkable. Reviewed CTA findings. Will perform trigger point injections today.    HPI:  Michelle Villarreal is a 77 y.o. female here as a referral from Dr. Nehemiah Settle and Dr. Haroldine Laws for right ear booming tinnitus. Past medical history of hypertension, fall, chronic pain, osteoarthritis, neuropathy, diabetes, cataracts fibromyalgia. Murmur started in the right ear. It went away then started booming, pounding, Sounds like blood flowing  through a vessel. She had a fall Nov 1st and symptoms started 3 weeks afterwards but she does not think this had anything to do with it, she did not hit her head. Sometimes it is soft other times is unbelieveble. Only in the right ear.  Sometimes it is roaring, sometimes like a banjo and her ear vibrates along with it and the pressure is severe. Started 2 months ago but in the last week have increased in severity. The pain behind the ear is new about a week. It is so loud it wakes her up in the middle of the night. Just in the right ear. Worse at night. Comes and goes during the day. Has tried alleve, tylenol doesn't help. Doesn't drink caffeine. She has tried lots of things, nothing helps. She is on a medrol dosepak now. She was given Decadron. No PMHx of headaches or migraines. May get a headache with blood pressure or with dehydration but rare. But until a week ago it was just noise,vibration and no pain. A week ago started feeling pressure in the ear and pain behind the ear. The pain behind the ear is tenderness, painful, not tingly, not electric. Tenderness behind the left ear and in the temples. Has also had stabbing pain through the ear then it went away. She also had a fall the last day of January at the same  spot, didn't hit her head. She cannot tolerate the MRI.    Reviewed notes, labs and imaging from outside physicians, which showed:   Labs were drawn 06/08/2015: CMP normal creatinine of 0.76. TSH 1.75.   Patient was evaluated by Dr. Haroldine Laws. She came with a complaint of right ear booming tinnitus related to her pulse. ENT evaluation reveals both ears to be completely clear after he remove cerumen from the right ear, tympanic membranes move well, no  evidence of fluid behind the tympanic membrane. Nose is completely clear. Larynx is clear. True cords, false cord, epiglottis, base of tongue, lateral pharyngeal walls are clear, and she cord mobility, Reflex, tongue mobility, extraocular movements,  facial nerve are all symmetrical. Neck is free of thyromegaly, cervical adenopathy or masses. Trachea midline. No evidence of bruit. No cardiac arrhythmias. No history of cardiac problem. Oriented 3. Diagnosis is possible vertigo, she has a history of orthostatic hypotension. Plan is for hearing test attempted MRI of the brain patient did not tolerate. On audiogram, she has a 15 dB speech reception threshold on both sides and in 96 and 100% discrimination score.   Review of Systems: Patient complains of symptoms per HPI as well as the following symptoms: insomnia, numbness, sleepiness, joint pain, cramps, allergies. Pertinent negatives per HPI. All others negative.   Social History   Socioeconomic History   Marital status: Single    Spouse name: Not on file   Number of children: 1   Years of education: Not on file   Highest education level: Not on file  Occupational History   Not on file  Tobacco Use   Smoking status: Former    Types: Cigarettes   Smokeless tobacco: Never  Vaping Use   Vaping status: Never Used  Substance and Sexual Activity   Alcohol use: Yes   Drug use: No   Sexual activity: Never  Other Topics Concern   Not on file  Social History Narrative   Lives alone   Caffeine use: minimal    Social Determinants of Health   Financial Resource Strain: Low Risk  (10/21/2020)   Overall Financial Resource Strain (CARDIA)    Difficulty of Paying Living Expenses: Not hard at all  Food Insecurity: No Food Insecurity (10/21/2020)   Hunger Vital Sign    Worried About Running Out of Food in the Last Year: Never true    Ran Out of Food in the Last Year: Never true  Transportation Needs: No Transportation Needs (10/21/2020)   PRAPARE - Administrator, Civil Service (Medical): No    Lack of Transportation (Non-Medical): No  Physical Activity: Insufficiently Active (10/21/2020)   Exercise Vital Sign    Days of Exercise per Week: 1 day    Minutes of Exercise per  Session: 30 min  Stress: Stress Concern Present (10/21/2020)   [employer] of Occupational Health - Occupational Stress Questionnaire    Feeling of Stress : Very much  Social Connections: Unknown (10/21/2020)   Social Connection and Isolation Panel [NHANES]    Frequency of Communication with Friends and Family: More than three times a week    Frequency of Social Gatherings with Friends and Family: More than three times a week    Attends Religious Services: 1 to 4 times per year    Active Member of [employer] or Organizations: No    Attends Banker Meetings: Never    Marital Status: Patient declined  Intimate Partner Violence: Not At Risk (10/21/2020)   Humiliation,  Afraid, Rape, and Kick questionnaire    Fear of Current or Ex-Partner: No    Emotionally Abused: No    Physically Abused: No    Sexually Abused: No    Family History  Problem Relation Age of Onset   Ovarian cancer Mother    Diabetes Sister    Arthritis Brother    Stroke Neg Hx    Migraines Neg Hx    Ataxia Neg Hx     Past Medical History:  Diagnosis Date   Adenoma    adrenal gland   Bladder incontinence    Cataracts, bilateral    Chronic pain    Fall    09/30/13, 03/15/15, 06/14/15   Fibromyalgia    Hypertension    Neuropathy    Osteoarthritis    Tinnitus     Past Surgical History:  Procedure Laterality Date   BREAST REDUCTION SURGERY     BUNIONECTOMY     REDUCTION MAMMAPLASTY Bilateral    REPLACEMENT TOTAL KNEE Bilateral 2003/2004   TUBAL LIGATION     VAGINAL HYSTERECTOMY      Current Outpatient Medications  Medication Sig Dispense Refill   Acetaminophen (TYLENOL 8 HOUR PO) Take by mouth.     aspirin EC 81 MG tablet Take 81 mg by mouth daily. Swallow whole.     Biotin 1000 MCG CHEW Chew by mouth.     CALCIUM PO Take by mouth.     Camphor-Menthol-Methyl Sal (SALONPAS EX) Apply topically.     Carboxymethylcellulose Sodium (DRY EYE RELIEF OP) Apply to eye.     Chlorpheniramine Maleate  (ALLERGY PO) Take by mouth.     Cholecalciferol (VITAMIN D3 PO) Take 800 Units by mouth 3 (three) times a week.     Cranberry-Vitamin C-Probiotic (AZO CRANBERRY PO) Take by mouth.     diclofenac Sodium (VOLTAREN) 1 % GEL Apply topically 4 (four) times daily.     diphenhydrAMINE HCl (BENADRYL ALLERGY PO) Take by mouth.     guaiFENesin (MUCINEX) 600 MG 12 hr tablet Take by mouth 2 (two) times daily.     lidocaine (LMX) 4 % cream Apply 1 Application topically as needed.     Liniments (SALONPAS PAIN RELIEF PATCH EX) Apply topically daily as needed.     Magnesium Hydroxide (DULCOLAX PO) Take by mouth.     MAGNESIUM PO Take 200 mg by mouth.     Methen-Hyosc-Meth Blue-Na Phos (UROGESIC-BLUE PO) Take 1 tablet by mouth 4 (four) times daily as needed.     naproxen sodium (ANAPROX) 220 MG tablet Take 220 mg by mouth every 8 (eight) hours as needed. AS NEEDED     olmesartan-hydrochlorothiazide (BENICAR HCT) 20-12.5 MG tablet Take 1 tablet by mouth once daily 90 tablet 0   Omega Fatty Acids-Vitamins (OMEGA-3 GUMMIES PO) Take by mouth.     Polyethyl Glycol-Propyl Glycol (SYSTANE OP) Apply to eye.     vitamin E 400 UNIT capsule Take 400-800 Units by mouth. 3-4 times weekly per pt     zinc gluconate 50 MG tablet Take 50 mg by mouth daily.     mirtazapine (REMERON SOL-TAB) 15 MG disintegrating tablet Take 1 tablet (15 mg total) by mouth at bedtime. 30 tablet 0   No current facility-administered medications for this visit.    Allergies as of 04/15/2023 - Review Complete 04/15/2023  Allergen Reaction Noted   Ultram [tramadol] Shortness Of Breath 01/25/2013   Acetazolamide  06/13/2020   Benzodiazepines Other (See Comments) 01/25/2013   Codeine Itching 01/25/2013  Demerol [meperidine] Nausea Only 01/25/2013   Duloxetine hcl  06/13/2020   Fentanyl  06/13/2020   Fondaparinux Other (See Comments) 01/25/2013   Gabapentin Other (See Comments) 01/25/2013   Lyrica [pregabalin] Other (See Comments) 01/25/2013    Morphine and codeine Hives 01/25/2013   Oxycodone-acetaminophen  06/13/2020   Pentazocine  06/13/2020   Zoloft [sertraline hcl] Other (See Comments) 01/25/2013    Vitals: BP (!) 142/88   Pulse 86   Ht 5\' 10"  (1.778 m)   Wt 239 lb (108.4 kg)   BMI 34.29 kg/m  Last Weight:  Wt Readings from Last 1 Encounters:  04/15/23 239 lb (108.4 kg)   Last Height:   Ht Readings from Last 1 Encounters:  04/15/23 5\' 10"  (1.778 m)   Physical exam: Exam: Gen: NAD, conversant      CV: No palpitations or chest pain or SOB. VS: Breathing at a normal rate. obese. Not febrile. Eyes: Conjunctivae clear without exudates or hemorrhage  Neuro: Detailed Neurologic Exam  Speech:    Speech is normal; fluent and spontaneous with normal comprehension.  Cognition:    04/15/2023    9:14 AM 10/21/2020   10:48 AM 02/17/2018    9:41 AM 12/28/2015   11:18 AM  MMSE - Mini Mental State Exam  Not completed:   --   Orientation to time 5 5 5 5   Orientation to Place 5 5 5 5   Registration 3 3 3 3   Attention/ Calculation 5 5 2 5   Recall 3 3 3 2   Language- name 2 objects 2 2 2 2   Language- repeat 1 1 1 1   Language- follow 3 step command 3 3 3 3   Language- read & follow direction 1 1 1 1   Write a sentence 1 1 1 1   Copy design 1 1 1 1   Total score 30 30 27 29      Cranial Nerves:    The pupils are equal, round, and reactive to light. Visual fields are full . Extraocular movements are intact.  Could not visualize fundi, pupils too small. The face is symmetric with normal sensation. The palate elevates in the midline. Hearing intact. Voice is normal. Shoulder shrug is normal. The tongue has normal motion without fasciculations.   Coordination: normal  Gait: Antalgic with cane  Motor Observation:   no involuntary movements noted. Tone:    normal  Posture:    Posture is normal. normal erect    Strength:    Strength is anti-gravity and symmetric in the upper and lower limbs.      Sensation:  intact to LT, no reports of numbness or tingling or paresthesias  Clonius: none  Toes: equiv  DTRs symmetrical                Assessment/Plan:  77 year old female here for years of progressive memory change again in the setting of chronic pain and stress. She is back again c/o memory problems. Her MMSE has not changed in years. Patient's daughter is here and feels she does it for attention. However patient is difficult to redirect and tangential, may now be starting to have MCI will work up.   - Memory loss: Likely multifactorial due to normal aging, chronic pain, stress. MMSE 30/30 2 years ago and today 30/30   - She was last seen in 2017 and we agree on an FDG PET Scan and we had it approved and she decided she did not want to continue.   Plan: ATN blood work to look  for alzheimer's proteins in the blood APOE4 genetic testing - to look for alzheimers genes MRi of the brain w/wo conytrast And if we find Alzheimer's pathology we will perfomr a CT Scan of the Brain looking for amyloid plaques in the brain Need follow up appointment based on results to treat if positive Formal neurocognitive testing Jenna Renfore For her orthopaedic issues, she is seeing murphy wainer  CC: Dr. Nehemiah Settle   Orders Placed This Encounter  Procedures   MR BRAIN W WO CONTRAST   APOE Alzheimer's Risk   ATN PROFILE   B12 and Folate Panel   Methylmalonic acid, serum   Vitamin B1   TSH Rfx on Abnormal to Free T4   RPR   Ammonia   CBC with Differential/Platelets   Comprehensive metabolic panel   Ambulatory referral to Neuropsychology    Naomie Dean, MD  [facility] [address] [place], [place] [address]  Phone [phone] Fax [phone]

## 2023-04-15 NOTE — Patient Instructions (Addendum)
ATN blood work to look for alzheimer's proteins in the blood APOE4 genetic testing - to look for alzheimers genes MRi of the brain w/wo conytrast And if we find Alzheimer's pathology we will perfomr a CT Scan of the Brain looking for amyloid plaques in the brain Need follow up appointment based on results to treat if positive Formal neurocognitive testing Jenna Renfore For her orthopaedic issues, she is seeing murphy wainer   Mild Neurocognitive Disorder Mild neurocognitive disorder, formerly known as mild cognitive impairment, is a disorder in which memory does not work as well as it should. This disorder may also cause problems with other mental functions, including thought, communication, behavior, and completion of tasks. These problems can be noticed and measured, but they usually do not interfere with daily activities or the ability to live independently. Mild neurocognitive disorder typically develops after 76 years of age, but it can also develop at younger ages. It is not as serious as major neurocognitive disorder, also known as dementia, but it may be the first sign of it. Generally, symptoms of this condition get worse over time. In rare cases, symptoms can get better. What are the causes? This condition may be caused by: Brain disorders like Alzheimer's disease, Parkinson's disease, and other conditions that gradually damage nerve cells (neurodegenerative conditions). Diseases that affect blood vessels in the brain and result in small strokes. Certain infections, such as HIV. Traumatic brain injury. Other medical conditions, such as brain tumors, underactive thyroid (hypothyroidism), and vitamin B12 deficiency. Use of certain drugs or prescription medicines. What increases the risk? The following factors may make you more likely to develop this condition: Being older than 65 years. Being female. Low education level. Diabetes, [place] blood pressure, Theard cholesterol, and other  conditions that increase the risk for blood vessel diseases. Untreated or undertreated sleep apnea. Having a certain type of gene that can be passed from parent to child (inherited). Chronic health problems such as heart disease, lung disease, liver disease, kidney disease, or depression. What are the signs or symptoms? Symptoms of this condition include: Difficulty remembering. You may: Forget names, phone numbers, or details of recent events. Forget social events and appointments. Repeatedly forget where you put your car keys or other items. Difficulty thinking and solving problems. You may have trouble with complex tasks, such as: Paying bills. Driving in unfamiliar places. Difficulty communicating. You may have trouble: Finding the right word or naming an object. Forming a sentence that makes sense, or understanding what you read or hear. Changes in your behavior or personality. When this happens, you may: Lose interest in the things that you used to enjoy. Withdraw from social situations. Get angry more easily than usual. Act before thinking. How is this diagnosed? This condition is diagnosed based on: Your symptoms. Your health care provider may ask you and the people you spend time with, such as family and friends, about your symptoms. Evaluation of mental functions (neuropsychological testing). Your health care provider may refer you to a neurologist or mental health specialist to evaluate your mental functions in detail. To identify the cause of your condition, your health care provider may: Get a detailed medical history. Ask about use of alcohol, drugs, and prescription medicines. Do a physical exam. Order blood tests and brain imaging exams. How is this treated? Mild neurocognitive disorder that is caused by medicine use, drug use, infection, or another medical condition may improve when the cause is treated, or when medicines or drugs are stopped.  If this disorder has  another cause, it generally does not improve and may get worse. In these cases, the goal of treatment is to help you manage the loss of mental function. Treatments in these cases include: Medicine. Medicine mainly helps memory and behavior symptoms. Talk therapy. Talk therapy provides education, emotional support, memory aids, and other ways of making up for problems with mental function. Lifestyle changes, including: Getting regular exercise. Eating a healthy diet that includes omega-3 fatty acids. Challenging your thinking and memory skills. Having more social interaction. Follow these instructions at home: Eating and drinking  Drink enough fluid to keep your urine pale yellow. Eat a healthy diet that includes omega-3 fatty acids. These can be found in: Fish. Nuts. Leafy vegetables. Vegetable oils. If you drink alcohol: Limit how much you use to: 0-1 drink a day for women. 0-2 drinks a day for men. Be aware of how much alcohol is in your drink. In the [place] one drink equals one 12 oz bottle of beer (355 mL), one 5 oz glass of wine (148 mL), or one 1 oz glass of hard liquor (44 mL). Lifestyle  Get regular exercise as told by your health care provider. Do not use any products that contain nicotine or tobacco, such as cigarettes, e-cigarettes, and chewing tobacco. If you need help quitting, ask your health care provider. Practice ways to manage stress. If you need help managing stress, ask your health care provider. Continue to have social interaction. Keep your mind active with stimulating activities you enjoy, such as reading or playing games. Make sure to get quality sleep. Follow these tips: Avoid napping during the day. Keep your sleeping area dark and cool. Avoid exercising during the few hours before you go to bed. Avoid caffeine products in the evening. General instructions Take over-the-counter and prescription medicines only as told by your health care provider. Your  health care provider may recommend that you avoid taking medicines that can affect thinking, such as pain medicines or sleep medicines. Work with your health care provider to find out what you need help with and what your safety needs are. Keep all follow-up visits. This is important. Where to find more information [employer] on Aging: https://walker.com/ Contact a health care provider if: You have any new symptoms. Get help right away if: You develop new confusion or your confusion gets worse. You act in ways that place you or your family in danger. Summary Mild neurocognitive disorder is a disorder in which memory does not work as well as it should. Mild neurocognitive disorder can have many causes. It may be the first stage of dementia. To manage your condition, get regular exercise, keep your mind active, get quality sleep, and eat a healthy diet. This information is not intended to replace advice given to you by your health care provider. Make sure you discuss any questions you have with your health care provider. Document Revised: 09/14/2019 Document Reviewed: 09/14/2019 Elsevier Patient Education  2024 [employer].

## 2023-04-16 LAB — TSH RFX ON ABNORMAL TO FREE T4: TSH: 2.01 u[IU]/mL (ref 0.450–4.500)

## 2023-04-17 ENCOUNTER — Telehealth: Payer: Self-pay | Admitting: Neurology

## 2023-04-17 NOTE — Telephone Encounter (Signed)
Referral for neuropsychology fax to [facility] to Dr. Rueben Bash. Phone: [phone], Fax: [phone]

## 2023-04-23 ENCOUNTER — Telehealth: Payer: Self-pay | Admitting: *Deleted

## 2023-04-23 NOTE — Telephone Encounter (Signed)
-----   Message from Anson Fret sent at 04/22/2023  5:32 PM EST ----- Blood owrk looks unremarkable, no alzheimers amyloid protein in the blood which is good news. She should still continue to [facility] and formal neuropsychiatric teating thankss

## 2023-04-23 NOTE — Telephone Encounter (Signed)
I called pt and gave her the results of her lab work as below.  She verbalized understanding.  Her MRI is scheduled.  The neuropsych eval is not in network with [facility]. I sent this back to [facility] in referrals to send to another provider.

## 2023-04-23 NOTE — Telephone Encounter (Signed)
I spoke to pt to give her lab results.  She said that [facility] called and they are not in network for her insurance [employer].

## 2023-04-27 LAB — APOE ALZHEIMER'S RISK

## 2023-04-27 LAB — COMPREHENSIVE METABOLIC PANEL
ALT: 27 [IU]/L (ref 0–32)
AST: 29 [IU]/L (ref 0–40)
Albumin: 4.3 g/dL (ref 3.8–4.8)
Alkaline Phosphatase: 71 [IU]/L (ref 44–121)
BUN/Creatinine Ratio: 21 (ref 12–28)
BUN: 15 mg/dL (ref 8–27)
Bilirubin Total: 0.4 mg/dL (ref 0.0–1.2)
CO2: 26 mmol/L (ref 20–29)
Calcium: 9.4 mg/dL (ref 8.7–10.3)
Chloride: 103 mmol/L (ref 96–106)
Creatinine, Ser: 0.7 mg/dL (ref 0.57–1.00)
Globulin, Total: 2.7 g/dL (ref 1.5–4.5)
Glucose: 89 mg/dL (ref 70–99)
Potassium: 4.2 mmol/L (ref 3.5–5.2)
Sodium: 142 mmol/L (ref 134–144)
Total Protein: 7 g/dL (ref 6.0–8.5)
eGFR: 90 mL/min/{1.73_m2} (ref >59)

## 2023-04-27 LAB — CBC WITH DIFFERENTIAL/PLATELET
Basophils Absolute: 0 10*3/uL (ref 0.0–0.2)
Basos: 1 %
EOS (ABSOLUTE): 0.2 10*3/uL (ref 0.0–0.4)
Eos: 5 %
Hematocrit: 40.7 % (ref 34.0–46.6)
Hemoglobin: 13.2 g/dL (ref 11.1–15.9)
Immature Grans (Abs): 0 10*3/uL (ref 0.0–0.1)
Immature Granulocytes: 0 %
Lymphocytes Absolute: 1.3 10*3/uL (ref 0.7–3.1)
Lymphs: 31 %
MCH: 30.9 pg (ref 26.6–33.0)
MCHC: 32.4 g/dL (ref 31.5–35.7)
MCV: 95 fL (ref 79–97)
Monocytes Absolute: 0.5 10*3/uL (ref 0.1–0.9)
Monocytes: 12 %
Neutrophils Absolute: 2.1 10*3/uL (ref 1.4–7.0)
Neutrophils: 51 %
Platelets: 156 10*3/uL (ref 150–450)
RBC: 4.27 x10E6/uL (ref 3.77–5.28)
RDW: 13.1 % (ref 11.7–15.4)
WBC: 4.1 10*3/uL (ref 3.4–10.8)

## 2023-04-27 LAB — ATN PROFILE
A -- Beta-amyloid 42/40 Ratio: 0.112 (ref >0.102)
Beta-amyloid 40: 155.48 pg/mL
Beta-amyloid 42: 17.38 pg/mL
N -- NfL, Plasma: 2.54 pg/mL (ref 0.00–7.64)
T -- p-tau181: 1.08 pg/mL — ABNORMAL HIGH (ref 0.00–0.97)

## 2023-04-27 LAB — B12 AND FOLATE PANEL
Folate: 19.5 ng/mL (ref >3.0)
Vitamin B-12: 1449 pg/mL — ABNORMAL HIGH (ref 232–1245)

## 2023-04-27 LAB — METHYLMALONIC ACID, SERUM: Methylmalonic Acid: 140 nmol/L (ref 0–378)

## 2023-04-27 LAB — VITAMIN B1: Thiamine: 138.3 nmol/L (ref 66.5–200.0)

## 2023-04-27 LAB — AMMONIA: Ammonia: 29 ug/dL — ABNORMAL LOW (ref 31–169)

## 2023-04-27 LAB — RPR: RPR Ser Ql: NONREACTIVE

## 2023-04-30 ENCOUNTER — Other Ambulatory Visit: Payer: Self-pay | Admitting: Nurse Practitioner

## 2023-04-30 DIAGNOSIS — I10 Essential (primary) hypertension: Secondary | ICD-10-CM

## 2023-05-01 ENCOUNTER — Ambulatory Visit: Payer: Medicare Other | Admitting: Cardiovascular Disease

## 2023-05-01 NOTE — Telephone Encounter (Signed)
Referral faxed to: [facility] Phone: [phone] Fax: [phone]

## 2023-05-02 ENCOUNTER — Telehealth: Payer: Self-pay | Admitting: *Deleted

## 2023-05-02 NOTE — Telephone Encounter (Signed)
-----   Message from Anson Fret sent at 04/28/2023  2:27 PM EST ----- Blood work is not consistent wih alzheimer's pathology but she does have one alzheimer's gene. I would proceed to formal memory testing and MRI and we can always follow up with more testing if formal neurocognitive testing or MRI is concerning thank you

## 2023-05-02 NOTE — Telephone Encounter (Signed)
Relayed results of lab results to pt.  Verbalized understanding.  MRI in January.  Waiting on second Neuropsych referral to call to set up. Placed copy of our labs in mail (to MRI) to mail.

## 2023-05-23 NOTE — Telephone Encounter (Addendum)
 Pt cancelled MRI appointment due to financial hardship. Pt said I know MRI needed but my eyes are more important right now. May be able to get done in a couple months.

## 2023-05-30 ENCOUNTER — Other Ambulatory Visit: Payer: Medicare Other

## 2023-06-20 ENCOUNTER — Encounter: Payer: Self-pay | Admitting: Nurse Practitioner

## 2023-06-20 ENCOUNTER — Ambulatory Visit (INDEPENDENT_AMBULATORY_CARE_PROVIDER_SITE_OTHER): Payer: Medicare Other | Admitting: Nurse Practitioner

## 2023-06-20 VITALS — BP 116/89 | HR 95 | Temp 97.9°F | Wt 237.0 lb

## 2023-06-20 DIAGNOSIS — G8929 Other chronic pain: Secondary | ICD-10-CM | POA: Diagnosis not present

## 2023-06-20 DIAGNOSIS — M5442 Lumbago with sciatica, left side: Secondary | ICD-10-CM

## 2023-06-20 DIAGNOSIS — I10 Essential (primary) hypertension: Secondary | ICD-10-CM | POA: Diagnosis not present

## 2023-06-20 MED ORDER — BACLOFEN 10 MG PO TABS: 10.0000 mg | ORAL_TABLET | Freq: Two times a day (BID) | ORAL | 0 refills | Status: DC

## 2023-06-20 NOTE — Progress Notes (Signed)
 Subjective   Patient ID: Michelle Villarreal, female    DOB: 02-23-1946, 78 y.o.   MRN: [id number]  Chief Complaint  Patient presents with   Medical Management of Chronic Issues    Patient had cataract surgery a week ago today     Referring provider: Oley Bascom RAMAN, NP  Michelle Villarreal is a 78 y.o. female with Past Medical History: No date: Adenoma     Comment:  adrenal gland No date: Bladder incontinence No date: Cataracts, bilateral No date: Chronic pain No date: Fall     Comment:  09/30/13, 03/15/15, 06/14/15 No date: Fibromyalgia No date: Hypertension No date: Neuropathy No date: Osteoarthritis No date: Tinnitus   HPI  Patient presents today for follow-up visit.  She did recently have blood work completed due to surgical procedure.  She did recently have cataract surgery.  Patient has followed with neurology and does have an MRI scheduled.  She does have ongoing hip pain but states that she does already have a orthopedic doctor. Denies f/c/s, n/v/d, hemoptysis, PND, leg swelling Denies chest pain or edema     Allergies  Allergen Reactions   Ultram [Tramadol] Shortness Of Breath   Acetazolamide      Other reaction(s): severe headaches   Benzodiazepines Other (See Comments)    Short term memory loss   Codeine Itching    Can take with benadryl in certain time length   Demerol [Meperidine] Nausea Only   Duloxetine Hcl     Other reaction(s): memory loss short term   Fentanyl     Other reaction(s): itching   Fondaparinux Other (See Comments)    Increased bleeding Other reaction(s): increase bleeding potential   Gabapentin Other (See Comments)    Memory loss   Lyrica [Pregabalin] Other (See Comments)    Memory loss   Morphine And Codeine Hives   Oxycodone-Acetaminophen      Other reaction(s): itching   Pentazocine     Other reaction(s): Itching   Zoloft [Sertraline Hcl] Other (See Comments)    Memory loss    Immunization History  Administered Date(s)  Administered   Fluad Quad([place] Dose 65+) 03/31/2022   Influenza Split 02/19/2013, 04/19/2014, 01/17/2015, 01/21/2016, 02/07/2017, 01/22/2018, 01/30/2019, 01/14/2020   Influenza, [place] Dose Seasonal PF 02/07/2017   Influenza-Unspecified 02/18/2021   PFIZER Comirnaty(Gray Top)Covid-19 Tri-Sucrose Vaccine 02/28/2022   PFIZER(Purple Top)SARS-COV-2 Vaccination 06/25/2019, 07/20/2019, 03/11/2020   Pneumococcal Conjugate-13 01/27/2014   Pneumococcal Polysaccharide-23 01/28/2013   Tdap 12/02/2017   Zoster Recombinant(Shingrix) 08/01/2021, 11/01/2021    Tobacco History: Social History   Tobacco Use  Smoking Status Former   Types: Cigarettes  Smokeless Tobacco Never   Counseling given: Not Answered   Outpatient Encounter Medications as of 06/20/2023  Medication Sig   Acetaminophen  (TYLENOL  8 HOUR PO) Take by mouth.   aspirin EC 81 MG tablet Take 81 mg by mouth daily. Swallow whole.   baclofen  (LIORESAL ) 10 MG tablet Take 1 tablet (10 mg total) by mouth 2 (two) times daily.   Biotin 1000 MCG CHEW Chew by mouth.   CALCIUM PO Take by mouth.   Camphor-Menthol-Methyl Sal (SALONPAS EX) Apply topically.   Carboxymethylcellulose Sodium (DRY EYE RELIEF OP) Apply to eye.   Chlorpheniramine Maleate (ALLERGY PO) Take by mouth.   Cholecalciferol (VITAMIN D3 PO) Take 800 Units by mouth 3 (three) times a week.   Cranberry-Vitamin C-Probiotic (AZO CRANBERRY PO) Take by mouth.   diclofenac Sodium (VOLTAREN) 1 % GEL Apply topically 4 (four) times daily.   diphenhydrAMINE HCl (  BENADRYL ALLERGY PO) Take by mouth.   guaiFENesin (MUCINEX) 600 MG 12 hr tablet Take by mouth 2 (two) times daily.   ketorolac  (ACULAR ) 0.5 % ophthalmic solution Place 1 drop into the left eye 4 (four) times daily.   lidocaine (LMX) 4 % cream Apply 1 Application topically as needed.   Liniments (SALONPAS PAIN RELIEF PATCH EX) Apply topically daily as needed.   Magnesium Hydroxide (DULCOLAX PO) Take by mouth.   MAGNESIUM PO Take 200  mg by mouth.   Methen-Hyosc-Meth Blue-Na Phos (UROGESIC-BLUE PO) Take 1 tablet by mouth 4 (four) times daily as needed.   naproxen sodium (ANAPROX) 220 MG tablet Take 220 mg by mouth every 8 (eight) hours as needed. AS NEEDED   ofloxacin (OCUFLOX) 0.3 % ophthalmic solution Place 1 drop into the left eye 4 (four) times daily.   olmesartan -hydrochlorothiazide (BENICAR  HCT) 20-12.5 MG tablet Take 1 tablet by mouth once daily   Omega Fatty Acids-Vitamins (OMEGA-3 GUMMIES PO) Take by mouth.   Polyethyl Glycol-Propyl Glycol (SYSTANE OP) Apply to eye.   prednisoLONE acetate (PRED FORTE) 1 % ophthalmic suspension Place 1 drop into the left eye 4 (four) times daily.   vitamin E 400 UNIT capsule Take 400-800 Units by mouth. 3-4 times weekly per pt   zinc gluconate 50 MG tablet Take 50 mg by mouth daily.   mirtazapine  (REMERON  SOL-TAB) 15 MG disintegrating tablet Take 1 tablet (15 mg total) by mouth at bedtime.   No facility-administered encounter medications on file as of 06/20/2023.    Review of Systems  Review of Systems  Constitutional: Negative.   HENT: Negative.    Cardiovascular: Negative.   Gastrointestinal: Negative.   Allergic/Immunologic: Negative.   Neurological: Negative.   Psychiatric/Behavioral: Negative.       Objective:   BP 116/89   Pulse 95   Temp 97.9 F (36.6 C)   Wt 237 lb (107.5 kg)   SpO2 98%   BMI 34.01 kg/m   Wt Readings from Last 5 Encounters:  06/20/23 237 lb (107.5 kg)  04/15/23 239 lb (108.4 kg)  03/12/23 235 lb (106.6 kg)  12/20/22 236 lb (107 kg)  06/20/22 237 lb 9.6 oz (107.8 kg)     Physical Exam Vitals and nursing note reviewed.  Constitutional:      General: She is not in acute distress.    Appearance: She is well-developed.  Cardiovascular:     Rate and Rhythm: Normal rate and regular rhythm.  Pulmonary:     Effort: Pulmonary effort is normal.     Breath sounds: Normal breath sounds.  Neurological:     Mental Status: She is alert and  oriented to person, place, and time.       Assessment & Plan:   Essential hypertension  Chronic left-sided low back pain with left-sided sciatica -     Baclofen ; Take 1 tablet (10 mg total) by mouth 2 (two) times daily.  Dispense: 60 each; Refill: 0     Return in about 6 months (around 12/18/2023).   Bascom GORMAN Borer, NP 06/20/2023

## 2023-08-15 ENCOUNTER — Other Ambulatory Visit: Payer: Self-pay | Admitting: Nurse Practitioner

## 2023-08-15 DIAGNOSIS — G8929 Other chronic pain: Secondary | ICD-10-CM

## 2023-08-30 ENCOUNTER — Other Ambulatory Visit: Payer: Self-pay | Admitting: Nurse Practitioner

## 2023-08-30 DIAGNOSIS — I10 Essential (primary) hypertension: Secondary | ICD-10-CM

## 2023-10-08 ENCOUNTER — Other Ambulatory Visit: Payer: Self-pay | Admitting: Nurse Practitioner

## 2023-10-08 DIAGNOSIS — G8929 Other chronic pain: Secondary | ICD-10-CM

## 2023-10-08 NOTE — Telephone Encounter (Signed)
 Please advise [facility]

## 2023-10-11 ENCOUNTER — Other Ambulatory Visit: Payer: Self-pay | Admitting: Obstetrics and Gynecology

## 2023-10-11 DIAGNOSIS — R102 Pelvic and perineal pain: Secondary | ICD-10-CM

## 2023-11-19 ENCOUNTER — Other Ambulatory Visit: Payer: Self-pay | Admitting: Nurse Practitioner

## 2023-11-19 DIAGNOSIS — G8929 Other chronic pain: Secondary | ICD-10-CM

## 2023-11-20 ENCOUNTER — Encounter: Payer: Self-pay | Admitting: Obstetrics and Gynecology

## 2023-11-25 ENCOUNTER — Encounter: Payer: Self-pay | Admitting: Obstetrics and Gynecology

## 2023-11-28 ENCOUNTER — Ambulatory Visit
Admission: RE | Admit: 2023-11-28 | Discharge: 2023-11-28 | Disposition: A | Discharge status: Home / Self Care | Source: Ambulatory Visit | Attending: Obstetrics and Gynecology | Admitting: Obstetrics and Gynecology

## 2023-11-28 DIAGNOSIS — R102 Pelvic and perineal pain: Secondary | ICD-10-CM

## 2023-11-28 MED ORDER — GADOPICLENOL 0.5 MMOL/ML IV SOLN
10.0000 mL | Freq: Once | INTRAVENOUS | Status: AC | PRN
  Administered 2023-11-28: 10 mL via INTRAVENOUS

## 2023-12-02 ENCOUNTER — Telehealth: Payer: Self-pay | Admitting: Neurology

## 2023-12-02 NOTE — Telephone Encounter (Signed)
 Ordered, thanks.

## 2023-12-02 NOTE — Addendum Note (Signed)
 Addended by: Yosmar Ryker B on: 12/02/2023 06:02 PM   Modules accepted: Orders

## 2023-12-02 NOTE — Telephone Encounter (Signed)
 Patient left me a voice mail that she saw Dr. Ines in December and was supposed to get an MRI at [facility] but she cancelled it due to the cost. She is asking to get it rescheduled but the order isn't active in her chart anymore, can you place a new order? thanks

## 2023-12-03 NOTE — Telephone Encounter (Signed)
 no auth required sent to [facility] [phone]

## 2023-12-12 ENCOUNTER — Ambulatory Visit: Admitting: Physical Therapy

## 2023-12-13 ENCOUNTER — Other Ambulatory Visit: Payer: Self-pay | Admitting: Nurse Practitioner

## 2023-12-13 DIAGNOSIS — I10 Essential (primary) hypertension: Secondary | ICD-10-CM

## 2023-12-26 ENCOUNTER — Other Ambulatory Visit: Payer: Self-pay | Admitting: Nurse Practitioner

## 2023-12-26 DIAGNOSIS — G8929 Other chronic pain: Secondary | ICD-10-CM

## 2024-01-03 ENCOUNTER — Encounter: Payer: Self-pay | Admitting: Nurse Practitioner

## 2024-01-03 ENCOUNTER — Ambulatory Visit (INDEPENDENT_AMBULATORY_CARE_PROVIDER_SITE_OTHER): Payer: Self-pay | Admitting: Nurse Practitioner

## 2024-01-03 VITALS — BP 138/89 | HR 84 | Temp 97.6°F | Wt 232.0 lb

## 2024-01-03 DIAGNOSIS — R102 Pelvic and perineal pain: Secondary | ICD-10-CM

## 2024-01-03 DIAGNOSIS — I10 Essential (primary) hypertension: Secondary | ICD-10-CM

## 2024-01-03 DIAGNOSIS — M5442 Lumbago with sciatica, left side: Secondary | ICD-10-CM

## 2024-01-03 DIAGNOSIS — G8929 Other chronic pain: Secondary | ICD-10-CM

## 2024-01-03 DIAGNOSIS — Z13220 Encounter for screening for lipoid disorders: Secondary | ICD-10-CM | POA: Diagnosis not present

## 2024-01-03 MED ORDER — BACLOFEN 10 MG PO TABS: 10.0000 mg | ORAL_TABLET | Freq: Two times a day (BID) | ORAL | 0 refills | Status: DC

## 2024-01-03 MED ORDER — PREDNISONE 20 MG PO TABS: 20.0000 mg | ORAL_TABLET | Freq: Every day | ORAL | 0 refills | Status: AC

## 2024-01-03 MED ORDER — LORAZEPAM 0.5 MG PO TABS: 0.5000 mg | ORAL_TABLET | Freq: Every day | ORAL | 0 refills | Status: DC | PRN

## 2024-01-03 NOTE — Progress Notes (Signed)
 Subjective   Patient ID: Michelle Villarreal, female    DOB: 06/16/1945, 78 y.o.   MRN: [id number]  Chief Complaint  Patient presents with   Medical Management of Chronic Issues    Referring provider: Oley Bascom RAMAN, NP  Michelle Villarreal is a 78 y.o. female with Past Medical History: No date: Adenoma     Comment:  adrenal gland No date: Bladder incontinence No date: Cataracts, bilateral No date: Chronic pain No date: Fall     Comment:  09/30/13, 03/15/15, 06/14/15 No date: Fibromyalgia No date: Hypertension No date: Neuropathy No date: Osteoarthritis No date: Tinnitus   HPI  Patient presents today for follow-up visit.  She has followed with neurology since her last visit here and does have a MRI scheduled of her brain tomorrow.  She was told that she does have the gene for Alzheimer's.  Patient did recently have an MRI of the abdomen and pelvis through OB/GYN for pelvic pain.  Overall it was normal.  There was no significant cause noted for the pain.  We will place a referral to pelvic floor specialist.  We will trial prednisone . Denies f/c/s, n/v/d, hemoptysis, PND, leg swelling Denies chest pain or edema       Allergies  Allergen Reactions   Ultram [Tramadol] Shortness Of Breath   Acetazolamide      Other reaction(s): severe headaches   Benzodiazepines Other (See Comments)    Short term memory loss   Codeine Itching    Can take with benadryl in certain time length   Demerol [Meperidine] Nausea Only   Duloxetine Hcl     Other reaction(s): memory loss short term   Fentanyl     Other reaction(s): itching   Fondaparinux Other (See Comments)    Increased bleeding Other reaction(s): increase bleeding potential   Gabapentin Other (See Comments)    Memory loss   Lyrica [Pregabalin] Other (See Comments)    Memory loss   Morphine And Codeine Hives   Oxycodone-Acetaminophen      Other reaction(s): itching   Pentazocine     Other reaction(s): Itching   Zoloft [Sertraline  Hcl] Other (See Comments)    Memory loss    Immunization History  Administered Date(s) Administered   Fluad Quad([place] Dose 65+) 03/31/2022   Fluzone Influenza virus vaccine,trivalent (IIV3), split virus 02/19/2013, 04/19/2014, 01/17/2015, 01/21/2016, 02/07/2017, 01/22/2018, 01/30/2019, 01/14/2020   Influenza, [place] Dose Seasonal PF 02/07/2017   Influenza-Unspecified 02/18/2021   PFIZER Comirnaty(Gray Top)Covid-19 Tri-Sucrose Vaccine 02/28/2022   PFIZER(Purple Top)SARS-COV-2 Vaccination 06/25/2019, 07/20/2019, 03/11/2020   Pneumococcal Conjugate-13 01/27/2014   Pneumococcal Polysaccharide-23 01/28/2013   Tdap 12/02/2017   Zoster Recombinant(Shingrix) 08/01/2021, 11/01/2021    Tobacco History: Social History   Tobacco Use  Smoking Status Former   Types: Cigarettes  Smokeless Tobacco Never   Counseling given: Not Answered   Outpatient Encounter Medications as of 01/03/2024  Medication Sig   LORazepam  (ATIVAN ) 0.5 MG tablet Take 1 tablet (0.5 mg total) by mouth daily as needed for anxiety.   predniSONE  (DELTASONE ) 20 MG tablet Take 1 tablet (20 mg total) by mouth daily with breakfast.   Acetaminophen  (TYLENOL  8 HOUR PO) Take by mouth.   aspirin EC 81 MG tablet Take 81 mg by mouth daily. Swallow whole.   baclofen  (LIORESAL ) 10 MG tablet Take 1 tablet (10 mg total) by mouth 2 (two) times daily.   Biotin 1000 MCG CHEW Chew by mouth.   CALCIUM PO Take by mouth.   Camphor-Menthol-Methyl Sal (SALONPAS EX) Apply  topically.   Carboxymethylcellulose Sodium (DRY EYE RELIEF OP) Apply to eye.   Chlorpheniramine Maleate (ALLERGY PO) Take by mouth.   Cholecalciferol (VITAMIN D3 PO) Take 800 Units by mouth 3 (three) times a week.   Cranberry-Vitamin C-Probiotic (AZO CRANBERRY PO) Take by mouth.   diclofenac Sodium (VOLTAREN) 1 % GEL Apply topically 4 (four) times daily.   diphenhydrAMINE HCl (BENADRYL ALLERGY PO) Take by mouth.   guaiFENesin (MUCINEX) 600 MG 12 hr tablet Take by mouth 2 (two)  times daily.   ketorolac  (ACULAR ) 0.5 % ophthalmic solution Place 1 drop into the left eye 4 (four) times daily.   lidocaine (LMX) 4 % cream Apply 1 Application topically as needed.   Liniments (SALONPAS PAIN RELIEF PATCH EX) Apply topically daily as needed.   Magnesium Hydroxide (DULCOLAX PO) Take by mouth.   MAGNESIUM PO Take 200 mg by mouth.   Methen-Hyosc-Meth Blue-Na Phos (UROGESIC-BLUE PO) Take 1 tablet by mouth 4 (four) times daily as needed.   mirtazapine  (REMERON  SOL-TAB) 15 MG disintegrating tablet Take 1 tablet (15 mg total) by mouth at bedtime.   naproxen sodium (ANAPROX) 220 MG tablet Take 220 mg by mouth every 8 (eight) hours as needed. AS NEEDED   ofloxacin (OCUFLOX) 0.3 % ophthalmic solution Place 1 drop into the left eye 4 (four) times daily.   olmesartan -hydrochlorothiazide (BENICAR  HCT) 20-12.5 MG tablet Take 1 tablet by mouth once daily   Omega Fatty Acids-Vitamins (OMEGA-3 GUMMIES PO) Take by mouth.   Polyethyl Glycol-Propyl Glycol (SYSTANE OP) Apply to eye.   prednisoLONE acetate (PRED FORTE) 1 % ophthalmic suspension Place 1 drop into the left eye 4 (four) times daily.   vitamin E 400 UNIT capsule Take 400-800 Units by mouth. 3-4 times weekly per pt   zinc gluconate 50 MG tablet Take 50 mg by mouth daily.   [DISCONTINUED] baclofen  (LIORESAL ) 10 MG tablet Take 1 tablet by mouth twice daily   No facility-administered encounter medications on file as of 01/03/2024.    Review of Systems  Review of Systems  Constitutional: Negative.   HENT: Negative.    Cardiovascular: Negative.   Gastrointestinal: Negative.   Allergic/Immunologic: Negative.   Neurological: Negative.   Psychiatric/Behavioral: Negative.       Objective:   BP 138/89   Pulse 84   Temp 97.6 F (36.4 C) (Oral)   Wt 232 lb (105.2 kg)   SpO2 98%   BMI 33.29 kg/m   Wt Readings from Last 5 Encounters:  01/03/24 232 lb (105.2 kg)  06/20/23 237 lb (107.5 kg)  04/15/23 239 lb (108.4 kg)  03/12/23  235 lb (106.6 kg)  12/20/22 236 lb (107 kg)     Physical Exam Vitals and nursing note reviewed.  Constitutional:      General: She is not in acute distress.    Appearance: She is well-developed.  Cardiovascular:     Rate and Rhythm: Normal rate and regular rhythm.  Pulmonary:     Effort: Pulmonary effort is normal.     Breath sounds: Normal breath sounds.  Neurological:     Mental Status: She is alert and oriented to person, place, and time.       Assessment & Plan:   Pelvic pain -     Ambulatory referral to Obstetrics / Gynecology -     predniSONE ; Take 1 tablet (20 mg total) by mouth daily with breakfast.  Dispense: 5 tablet; Refill: 0  Lipid screening -     Lipid panel  Essential  hypertension -     CBC -     Comprehensive metabolic panel with GFR  Chronic left-sided low back pain with left-sided sciatica -     Baclofen ; Take 1 tablet (10 mg total) by mouth 2 (two) times daily.  Dispense: 60 tablet; Refill: 0  Other orders -     LORazepam ; Take 1 tablet (0.5 mg total) by mouth daily as needed for anxiety.  Dispense: 20 tablet; Refill: 0     Return in about 6 months (around 07/05/2024).   Bascom GORMAN Borer, NP 01/03/2024

## 2024-01-04 LAB — LIPID PANEL
Chol/HDL Ratio: 2.7 ratio (ref 0.0–4.4)
Cholesterol, Total: 213 mg/dL — ABNORMAL HIGH (ref 100–199)
HDL: 80 mg/dL (ref >39)
LDL Chol Calc (NIH): 123 mg/dL — ABNORMAL HIGH (ref 0–99)
Triglycerides: 57 mg/dL (ref 0–149)
VLDL Cholesterol Cal: 10 mg/dL (ref 5–40)

## 2024-01-04 LAB — COMPREHENSIVE METABOLIC PANEL WITH GFR
ALT: 33 IU/L — ABNORMAL HIGH (ref 0–32)
AST: 42 IU/L — ABNORMAL HIGH (ref 0–40)
Albumin: 4.5 g/dL (ref 3.8–4.8)
Alkaline Phosphatase: 65 IU/L (ref 44–121)
BUN/Creatinine Ratio: 14 (ref 12–28)
BUN: 10 mg/dL (ref 8–27)
Bilirubin Total: 0.4 mg/dL (ref 0.0–1.2)
CO2: 24 mmol/L (ref 20–29)
Calcium: 9.7 mg/dL (ref 8.7–10.3)
Chloride: 103 mmol/L (ref 96–106)
Creatinine, Ser: 0.69 mg/dL (ref 0.57–1.00)
Globulin, Total: 2.6 g/dL (ref 1.5–4.5)
Glucose: 103 mg/dL — ABNORMAL HIGH (ref 70–99)
Potassium: 4.1 mmol/L (ref 3.5–5.2)
Sodium: 140 mmol/L (ref 134–144)
Total Protein: 7.1 g/dL (ref 6.0–8.5)
eGFR: 90 mL/min/1.73 (ref >59)

## 2024-01-04 LAB — CBC
Hematocrit: 39.4 % (ref 34.0–46.6)
Hemoglobin: 12.6 g/dL (ref 11.1–15.9)
MCH: 31 pg (ref 26.6–33.0)
MCHC: 32 g/dL (ref 31.5–35.7)
MCV: 97 fL (ref 79–97)
Platelets: 158 x10E3/uL (ref 150–450)
RBC: 4.07 x10E6/uL (ref 3.77–5.28)
RDW: 13.1 % (ref 11.7–15.4)
WBC: 4.7 x10E3/uL (ref 3.4–10.8)

## 2024-01-06 ENCOUNTER — Ambulatory Visit: Payer: Self-pay | Admitting: Nurse Practitioner

## 2024-01-09 ENCOUNTER — Ambulatory Visit: Payer: Self-pay | Admitting: Neurology

## 2024-01-09 ENCOUNTER — Ambulatory Visit
Admission: RE | Admit: 2024-01-09 | Discharge: 2024-01-09 | Disposition: A | Discharge status: Home / Self Care | Source: Ambulatory Visit | Attending: Neurology

## 2024-01-09 DIAGNOSIS — R413 Other amnesia: Secondary | ICD-10-CM

## 2024-01-09 DIAGNOSIS — G301 Alzheimer's disease with late onset: Secondary | ICD-10-CM

## 2024-01-09 DIAGNOSIS — G309 Alzheimer's disease, unspecified: Secondary | ICD-10-CM

## 2024-01-09 DIAGNOSIS — G3184 Mild cognitive impairment, so stated: Secondary | ICD-10-CM

## 2024-01-09 DIAGNOSIS — R4189 Other symptoms and signs involving cognitive functions and awareness: Secondary | ICD-10-CM

## 2024-01-09 MED ORDER — GADOPICLENOL 0.5 MMOL/ML IV SOLN
10.0000 mL | Freq: Once | INTRAVENOUS | Status: AC | PRN
  Administered 2024-01-09: 10 mL via INTRAVENOUS

## 2024-01-15 ENCOUNTER — Ambulatory Visit (HOSPITAL_COMMUNITY)
Admission: EM | Admit: 2024-01-15 | Discharge: 2024-01-15 | Disposition: A | Discharge status: Home / Self Care | Attending: Internal Medicine | Admitting: Internal Medicine

## 2024-01-15 ENCOUNTER — Ambulatory Visit (INDEPENDENT_AMBULATORY_CARE_PROVIDER_SITE_OTHER)

## 2024-01-15 ENCOUNTER — Encounter (HOSPITAL_COMMUNITY): Payer: Self-pay

## 2024-01-15 ENCOUNTER — Ambulatory Visit (HOSPITAL_COMMUNITY): Payer: Self-pay | Admitting: Internal Medicine

## 2024-01-15 DIAGNOSIS — R0602 Shortness of breath: Secondary | ICD-10-CM

## 2024-01-15 DIAGNOSIS — J209 Acute bronchitis, unspecified: Secondary | ICD-10-CM | POA: Diagnosis not present

## 2024-01-15 MED ORDER — DEXAMETHASONE SODIUM PHOSPHATE 10 MG/ML IJ SOLN
INTRAMUSCULAR | Status: AC
  Filled 2024-01-15: qty 1

## 2024-01-15 MED ORDER — ALBUTEROL SULFATE HFA 108 (90 BASE) MCG/ACT IN AERS: 2.0000 | INHALATION_SPRAY | RESPIRATORY_TRACT | 0 refills | Status: AC | PRN

## 2024-01-15 MED ORDER — DEXAMETHASONE SODIUM PHOSPHATE 10 MG/ML IJ SOLN
10.0000 mg | Freq: Once | INTRAMUSCULAR | Status: AC
  Administered 2024-01-15: 10 mg via INTRAMUSCULAR

## 2024-01-15 MED ORDER — AZITHROMYCIN 250 MG PO TABS: 250.0000 mg | ORAL_TABLET | Freq: Every day | ORAL | 0 refills | Status: AC

## 2024-01-15 MED ORDER — IPRATROPIUM-ALBUTEROL 0.5-2.5 (3) MG/3ML IN SOLN
RESPIRATORY_TRACT | Status: AC
  Filled 2024-01-15: qty 3

## 2024-01-15 MED ORDER — IPRATROPIUM-ALBUTEROL 0.5-2.5 (3) MG/3ML IN SOLN
3.0000 mL | Freq: Once | RESPIRATORY_TRACT | Status: AC
  Administered 2024-01-15: 3 mL via RESPIRATORY_TRACT

## 2024-01-15 NOTE — ED Triage Notes (Signed)
 Pt present with a cough x one week. States she has taken what she can and has been feeling worse. Pt denies fever. Reports the cough is continuous and has not been able to sleep.

## 2024-01-15 NOTE — Discharge Instructions (Signed)
 You have bronchitis which is inflammation of the upper airways in your lungs due to a virus.   Your chest x-ray looks normal and does not show any signs of pneumonia.  I would like to treat for atypical infection with azithromycin  antibiotic.  Take antibiotic as prescribed on package.  We gave you an injection of steroid in the clinic today to jumpstart treatment of inflammation to the lungs.   Use albuterol  inhaler 2 puffs every 4-6 hours on a schedule for the next 24 hours while the steroid kicks in, then as needed for cough, shortness of breath, and wheezing.   Continue using guaifenesin DM to break up congestion in nose/chest so that you are able to excrete easier. Drink plenty of fluids to stay well hydrated while taking mucinex so that it works well in the body.   If you develop any new or worsening symptoms or if your symptoms do not start to improve, please return here or follow-up with your primary care provider. If your symptoms are severe, please go to the emergency room.

## 2024-01-15 NOTE — ED Provider Notes (Addendum)
 [facility]    CSN: [id number] Arrival date & time: 01/15/24  1021      History   Chief Complaint Chief Complaint  Patient presents with   Cough    HPI Michelle Villarreal is a 78 y.o. female.   Michelle Villarreal is a 78 y.o. female presenting for chief complaint of Cough, congestion, shortness of breath associated with cough, and generalized fatigue that started 1 week ago.  Cough is productive with clear/yellow sputum.  Reports shortness of breath associated with cough.  Symptoms worsen at nighttime.  Denies chest pain, heart palpitations, leg swelling, orthopnea, dizziness, fever, chills, nausea, vomiting, diarrhea, and rash.  No recent sick contacts with similar symptoms to her knowledge.  Denies history of COPD/asthma.  History of bronchitis, states this feels similar. Multiple allergies to pain and psychiatric medications.  No allergies to antibiotics. Denies recent antibiotic or steroid use.  History of osteopenia. Taking over-the-counter medications such as Mucinex and Delsym with minimal relief.   Cough   Past Medical History:  Diagnosis Date   Adenoma    adrenal gland   Bladder incontinence    Cataracts, bilateral    Chronic pain    Fall    09/30/13, 03/15/15, 06/14/15   Fibromyalgia    Hypertension    Neuropathy    Osteoarthritis    Tinnitus     Patient Active Problem List   Diagnosis Date Noted   Neuropathy 12/21/2022   Age-related osteoporosis without current pathological fracture 07/19/2020   Anxiety disorder 07/19/2020   Hardening of the aorta (main artery of the heart) (HCC) 07/19/2020   Major depression single episode, in partial remission (HCC) 07/19/2020   Morbid obesity (HCC) 07/19/2020   Obstructive sleep apnea syndrome 07/19/2020   History of colonic polyps 07/19/2020   Pure hypercholesterolemia 07/19/2020   Tinnitus 07/19/2020   Sleep disorder 07/19/2020   Varicose veins of left lower extremity with other complications 07/19/2020    Subjective memory complaints 02/17/2018   Mixed stress and urge urinary incontinence 12/24/2017   Pulsatile tinnitus of right ear 07/27/2015   Essential hypertension 07/27/2015   Hereditary and idiopathic peripheral neuropathy 07/27/2015   Fibromyalgia 07/27/2015    Past Surgical History:  Procedure Laterality Date   BREAST REDUCTION SURGERY     BUNIONECTOMY     REDUCTION MAMMAPLASTY Bilateral    REPLACEMENT TOTAL KNEE Bilateral 2003/2004   TUBAL LIGATION     VAGINAL HYSTERECTOMY      OB History   No obstetric history on file.      Home Medications    Prior to Admission medications   Medication Sig Start Date End Date Taking? Authorizing Provider  albuterol  (VENTOLIN  HFA) 108 (90 Base) MCG/ACT inhaler Inhale 2 puffs into the lungs every 4 (four) hours as needed for wheezing or shortness of breath. 01/15/24  Yes Enedelia Dorna HERO, FNP  azithromycin  (ZITHROMAX ) 250 MG tablet Take 1 tablet (250 mg total) by mouth daily. Take first 2 tablets together, then 1 every day until finished. 01/15/24  Yes Enedelia Dorna HERO, FNP  Acetaminophen  (TYLENOL  8 HOUR PO) Take by mouth.    [provider]  aspirin EC 81 MG tablet Take 81 mg by mouth daily. Swallow whole.    [provider]  baclofen  (LIORESAL ) 10 MG tablet Take 1 tablet (10 mg total) by mouth 2 (two) times daily. 01/03/24   Nichols, Tonya S, NP  Biotin 1000 MCG CHEW Chew by mouth.    [provider]  CALCIUM PO Take by mouth.    [provider]  Camphor-Menthol-Methyl Sal (SALONPAS EX) Apply topically.    [provider]  Carboxymethylcellulose Sodium (DRY EYE RELIEF OP) Apply to eye.    [provider]  Chlorpheniramine Maleate (ALLERGY PO) Take by mouth.    [provider]  Cholecalciferol (VITAMIN D3 PO) Take 800 Units by mouth 3 (three) times a week.    [provider]  Cranberry-Vitamin C-Probiotic (AZO CRANBERRY PO) Take by mouth.    [provider]  diclofenac Sodium (VOLTAREN) 1 % GEL Apply topically 4 (four) times daily.    [provider]  diphenhydrAMINE HCl (BENADRYL ALLERGY PO) Take by mouth.    [provider]  guaiFENesin (MUCINEX) 600 MG 12 hr tablet Take by mouth 2 (two) times daily.    [provider]  ketorolac  (ACULAR ) 0.5 % ophthalmic solution Place 1 drop into the left eye 4 (four) times daily. 06/14/23   [provider]  lidocaine (LMX) 4 % cream Apply 1 Application topically as needed.    [provider]  Liniments (SALONPAS PAIN RELIEF PATCH EX) Apply topically daily as needed.    [provider]  LORazepam  (ATIVAN ) 0.5 MG tablet Take 1 tablet (0.5 mg total) by mouth daily as needed for anxiety. 01/03/24   Nichols, Tonya S, NP  Magnesium Hydroxide (DULCOLAX PO) Take by mouth.    [provider]  MAGNESIUM PO Take 200 mg by mouth.    [provider]  Methen-Hyosc-Meth Blue-Na Phos (UROGESIC-BLUE PO) Take 1 tablet by mouth 4 (four) times daily as needed.    [provider]  mirtazapine  (REMERON  SOL-TAB) 15 MG disintegrating tablet Take 1 tablet (15 mg total) by mouth at bedtime. 06/20/22 07/20/22  Oley Bascom RAMAN, NP  naproxen sodium (ANAPROX) 220 MG tablet Take 220 mg by mouth every 8 (eight) hours as needed. AS NEEDED    [provider]  ofloxacin (OCUFLOX) 0.3 % ophthalmic solution Place 1 drop into the left eye 4 (four) times daily. 06/14/23   [provider]  olmesartan -hydrochlorothiazide (BENICAR  HCT) 20-12.5 MG tablet Take 1 tablet by mouth once daily 12/13/23   Nichols, Tonya S, NP  Omega Fatty Acids-Vitamins (OMEGA-3 GUMMIES PO) Take by mouth.    [provider]  Polyethyl Glycol-Propyl Glycol (SYSTANE OP) Apply to eye.    [provider]  prednisoLONE acetate (PRED FORTE) 1 % ophthalmic suspension Place 1 drop into the left eye 4 (four) times daily. 06/14/23   [provider]   predniSONE  (DELTASONE ) 20 MG tablet Take 1 tablet (20 mg total) by mouth daily with breakfast. 01/03/24   Oley Bascom RAMAN, NP  vitamin E 400 UNIT capsule Take 400-800 Units by mouth. 3-4 times weekly per pt    [provider]  zinc gluconate 50 MG tablet Take 50 mg by mouth daily.    [provider]    Family History Family History  Problem Relation Age of Onset   Ovarian cancer Mother    Diabetes Sister    Arthritis Brother    Stroke Neg Hx    Migraines Neg Hx    Ataxia Neg Hx     Social History Social History   Tobacco Use   Smoking status: Former    Types: Cigarettes   Smokeless tobacco: Never  Vaping Use   Vaping status: Never Used  Substance Use Topics   Alcohol use: Yes   Drug use: No  Allergies   Ultram [tramadol], Acetazolamide , Benzodiazepines, Codeine, Demerol [meperidine], Duloxetine hcl, Fentanyl, Fondaparinux, Gabapentin, Lyrica [pregabalin], Morphine and codeine, Oxycodone-acetaminophen , Pentazocine, and Zoloft [sertraline hcl]   Review of Systems Review of Systems  Respiratory:  Positive for cough.   Per HPI   Physical Exam Triage Vital Signs ED Triage Vitals  Encounter Vitals Group     BP 01/15/24 1208 (!) 146/92     Girls Systolic BP Percentile --      Girls Diastolic BP Percentile --      Boys Systolic BP Percentile --      Boys Diastolic BP Percentile --      Pulse Rate 01/15/24 1206 79     Resp 01/15/24 1206 15     Temp 01/15/24 1208 98.3 F (36.8 C)     Temp Source 01/15/24 1206 Oral     SpO2 01/15/24 1206 99 %     Weight --      Height --      Head Circumference --      Peak Flow --      Pain Score 01/15/24 1205 0     Pain Loc --      Pain Education --      Exclude from Growth Chart --    No data found.  Updated Vital Signs BP (!) 162/79 (BP Location: Left Arm) Comment (BP Location): large cuff  Pulse 94   Temp 98.3 F (36.8 C)   Resp 20   SpO2 97%   Visual Acuity Right Eye Distance:   Left Eye  Distance:   Bilateral Distance:    Right Eye Near:   Left Eye Near:    Bilateral Near:     Physical Exam Vitals and nursing note reviewed.  Constitutional:      Appearance: She is not ill-appearing or toxic-appearing.  HENT:     Head: Normocephalic and atraumatic.     Right Ear: Hearing, tympanic membrane, ear canal and external ear normal.     Left Ear: Hearing, tympanic membrane, ear canal and external ear normal.     Nose: Nose normal.     Mouth/Throat:     Lips: Pink.     Mouth: Mucous membranes are moist. No injury or oral lesions.     Dentition: Normal dentition.     Tongue: No lesions.     Pharynx: Oropharynx is clear. Uvula midline. Posterior oropharyngeal erythema present. No pharyngeal swelling, oropharyngeal exudate, uvula swelling or postnasal drip.     Tonsils: No tonsillar exudate.  Eyes:     General: Lids are normal. Vision grossly intact. Gaze aligned appropriately.     Extraocular Movements: Extraocular movements intact.     Conjunctiva/sclera: Conjunctivae normal.  Neck:     Trachea: Trachea and phonation normal.  Cardiovascular:     Rate and Rhythm: Normal rate and regular rhythm.     Heart sounds: Normal heart sounds, S1 normal and S2 normal.  Pulmonary:     Effort: Pulmonary effort is normal. No respiratory distress.     Breath sounds: Normal air entry. Wheezing present. No rhonchi or rales.     Comments: Expiratory wheezing heard all lung fields bilaterally.  Speaking in full sentences without difficulty. Harsh and productive sounding cough on exam with deep inspiration. Chest:     Chest wall: No tenderness.  Musculoskeletal:     Cervical back: Neck supple.  Lymphadenopathy:     Cervical: No cervical adenopathy.  Skin:    General: Skin is warm  and dry.     Capillary Refill: Capillary refill takes less than 2 seconds.     Findings: No rash.  Neurological:     General: No focal deficit present.     Mental Status: She is alert and oriented to person,  place, and time. Mental status is at baseline.     Cranial Nerves: No dysarthria or facial asymmetry.  Psychiatric:        Mood and Affect: Mood normal.        Speech: Speech normal.        Behavior: Behavior normal.        Thought Content: Thought content normal.        Judgment: Judgment normal.      UC Treatments / Results  Labs (all labs ordered are listed, but only abnormal results are displayed) Labs Reviewed - No data to display  EKG   Radiology DG Chest 2 View Result Date: 01/15/2024 CLINICAL DATA:  Cough and shortness of breath for 1 week. EXAM: CHEST - 2 VIEW COMPARISON:  March 12, 2023. FINDINGS: The heart size and mediastinal contours are within normal limits. Both lungs are clear. The visualized skeletal structures are unremarkable. IMPRESSION: No active cardiopulmonary disease. Electronically Signed   By: Lynwood Landy Raddle M.D.   On: 01/15/2024 13:55    Procedures Procedures (including critical care time)  Medications Ordered in UC Medications  ipratropium-albuterol  (DUONEB) 0.5-2.5 (3) MG/3ML nebulizer solution 3 mL (3 mLs Nebulization Given 01/15/24 1253)  dexamethasone  (DECADRON ) injection 10 mg (10 mg Intramuscular Given 01/15/24 1251)    Initial Impression / Assessment and Plan / UC Course  I have reviewed the triage vital signs and the nursing notes.  Pertinent labs & imaging results that were available during my care of the patient were reviewed by me and considered in my medical decision making (see chart for details).   1.  Acute bronchitis, shortness of breath Presentation consistent with acute viral bronchitis.  DuoNeb nebulizer treatment given with significant symptomatic improvement reported by patient and lung sounds on reassessment.   Viral testing: deferred based on timing of illness.   Chest x-ray shows no acute cardiopulmonary abnormality by my interpretation on wet read.  Patient discharged prior to radiology re-read, staff will notify  patient if radiology re-read requires change in treatment plan at discussed prior to discharge from office.    Treatment plan:  Dexamethasone  10 mg IM steroid given today. History of osteoporosis, therefore I would like to avoid oral prednisone . We agreed that IM steroid is necessary to help with breathing problem and inflammation to the lungs today.  Continue guaifenesin DM.  Add on 2 puffs of albuterol  every 4-6 hours as needed for cough and shortness of breath/wheezing.  I would also like to treat with azithromycin  antibiotic to cover for atypical pathogens. I am unable to give patient further steroid due to her osteoporosis, azithromycin  will further help with inflammation to the chest.  Counseled patient on potential for adverse effects with medications prescribed/recommended today, strict ER and return-to-clinic precautions discussed, patient verbalized understanding.    Final Clinical Impressions(s) / UC Diagnoses   Final diagnoses:  Shortness of breath  Acute bronchitis, unspecified organism     Discharge Instructions      You have bronchitis which is inflammation of the upper airways in your lungs due to a virus.   Your chest x-ray looks normal and does not show any signs of pneumonia.  I would like to treat for atypical  infection with azithromycin  antibiotic.  Take antibiotic as prescribed on package.  We gave you an injection of steroid in the clinic today to jumpstart treatment of inflammation to the lungs.   Use albuterol  inhaler 2 puffs every 4-6 hours on a schedule for the next 24 hours while the steroid kicks in, then as needed for cough, shortness of breath, and wheezing.   Continue using guaifenesin DM to break up congestion in nose/chest so that you are able to excrete easier. Drink plenty of fluids to stay well hydrated while taking mucinex so that it works well in the body.   If you develop any new or worsening symptoms or if your symptoms do not start to  improve, please return here or follow-up with your primary care provider. If your symptoms are severe, please go to the emergency room.      ED Prescriptions     Medication Sig Dispense Auth. Provider   azithromycin  (ZITHROMAX ) 250 MG tablet Take 1 tablet (250 mg total) by mouth daily. Take first 2 tablets together, then 1 every day until finished. 6 tablet Enedelia Dorna HERO, FNP   albuterol  (VENTOLIN  HFA) 108 (90 Base) MCG/ACT inhaler Inhale 2 puffs into the lungs every 4 (four) hours as needed for wheezing or shortness of breath. 18 g Enedelia Dorna HERO, FNP      PDMP not reviewed this encounter.   Enedelia Dorna HERO, FNP 01/15/24 1417    Enedelia Dorna HERO, FNP 01/15/24 [phone]

## 2024-01-16 NOTE — Telephone Encounter (Signed)
 I called patient. I had an extended conversation with her regarding her MRI brain results. Patient would like to defer neuro cognitive testing at this time. She complains of significant pelvic pain that needs to be address with her OBGYN. She will call us  when she is ready for cognitive testing. She has transportation issues as well. Pt verbalized understanding of results. Pt had no questions at this time but was encouraged to call back if questions arise.

## 2024-01-16 NOTE — Telephone Encounter (Signed)
-----   Message from Onetha KATHEE Epp sent at 01/09/2024  5:37 PM EDT ----- MRI of the brain is unremarkable, nothing concerning seen and no significant changes from the last one. However this does not rule out memory loss or even dementia - she should definitely proceed to  the formal neurocognitive teasting we ordered. Thank you! ----- Message ----- From: Rosemarie Eather RAMAN, MD Sent: 01/09/2024   4:28 PM EDT To: Onetha KATHEE Epp, MD

## 2024-01-20 ENCOUNTER — Ambulatory Visit: Payer: Self-pay

## 2024-01-20 NOTE — Telephone Encounter (Signed)
 FYI Only or Action Required?: FYI only for provider.  Patient was last seen in primary care on 01/03/2024 by Oley Bascom RAMAN, NP.  Called Nurse Triage reporting Cough wheezing (no SOB or fever).  Symptoms began 01/08/24.  Interventions attempted: Prescription medications: Albuterol /z pak .  Symptoms are: gradually improving.  Triage Disposition: See Physician Within 24 Hours (overriding See HCP Within 4 Hours (Or PCP Triage))  Patient/caregiver understands and will follow disposition?:yes       Copied from CRM [id number]. Topic: Clinical - Medical Advice >> Jan 20, 2024 11:52 AM Debby BROCKS wrote: Reason for CRM: Patient was seen on an urgent care on the 3rd for acute Bronchitis and would like to speak to a nurse about the visit and some lingering issues Reason for Disposition  Wheezing is present  Answer Assessment - Initial Assessment Questions 1. ONSET: When did the cough begin?      Seen on 01/16/24 and given a z pack 2. SEVERITY: How bad is the cough today?      *No Answer* 3. SPUTUM: Describe the color of your sputum (e.g., none, dry cough; clear, white, yellow, green)     Faint yellow  4. HEMOPTYSIS: Are you coughing up any blood? If Yes, ask: How much? (e.g., flecks, streaks, tablespoons, etc.)     no 5. DIFFICULTY BREATHING: Are you having difficulty breathing? If Yes, ask: How bad is it? (e.g., mild, moderate, severe)      No SOB  6. FEVER: Do you have a fever? If Yes, ask: What is your temperature, how was it measured, and when did it start?     *No Answer* 8. LUNG HISTORY: Do you have any history of lung disease?  (e.g., pulmonary embolus, asthma, emphysema)     *No Answer* 9. PE RISK FACTORS: Do you have a history of blood clots? (or: recent major surgery, recent prolonged travel, bedridden)     *No Answer* 10. OTHER SYMPTOMS: Do you have any other symptoms? (e.g., runny nose, wheezing, chest pain)       Wheezing persisting despite MDI x 5  days  Protocols used: Cough - Acute Productive-A-AH

## 2024-01-23 ENCOUNTER — Encounter: Payer: Self-pay | Admitting: Nurse Practitioner

## 2024-01-23 ENCOUNTER — Ambulatory Visit (INDEPENDENT_AMBULATORY_CARE_PROVIDER_SITE_OTHER): Admitting: Nurse Practitioner

## 2024-01-23 VITALS — BP 131/81 | HR 95 | Temp 98.2°F | Wt 232.0 lb

## 2024-01-23 DIAGNOSIS — R6889 Other general symptoms and signs: Secondary | ICD-10-CM | POA: Diagnosis not present

## 2024-01-23 DIAGNOSIS — R059 Cough, unspecified: Secondary | ICD-10-CM

## 2024-01-23 DIAGNOSIS — J069 Acute upper respiratory infection, unspecified: Secondary | ICD-10-CM | POA: Diagnosis not present

## 2024-01-23 LAB — POC COVID19/FLU A&B COMBO
Covid Antigen, POC: NEGATIVE
Influenza A Antigen, POC: NEGATIVE
Influenza B Antigen, POC: NEGATIVE

## 2024-01-23 MED ORDER — BENZONATATE 200 MG PO CAPS: 200.0000 mg | ORAL_CAPSULE | Freq: Two times a day (BID) | ORAL | 0 refills | Status: AC | PRN

## 2024-01-23 MED ORDER — PREDNISONE 20 MG PO TABS: 20.0000 mg | ORAL_TABLET | Freq: Every day | ORAL | 0 refills | Status: AC

## 2024-01-23 MED ORDER — EZETIMIBE 10 MG PO TABS: 10.0000 mg | ORAL_TABLET | Freq: Every day | ORAL | 0 refills | Status: AC

## 2024-01-23 MED ORDER — PROMETHAZINE-DM 6.25-15 MG/5ML PO SYRP: 5.0000 mL | ORAL_SOLUTION | Freq: Every evening | ORAL | 0 refills | Status: AC | PRN

## 2024-01-23 NOTE — Progress Notes (Signed)
 Subjective   Patient ID: Michelle Villarreal, female    DOB: [birth year], 78 y.o.   MRN: [id number]  Chief Complaint  Patient presents with   Cough    Flu like symptoms     Referring provider: Oley Bascom RAMAN, NP  Cindia MALVA Shall is a 78 y.o. female with Past Medical History: No date: Adenoma     Comment:  adrenal gland No date: Bladder incontinence No date: Cataracts, bilateral No date: Chronic pain No date: Fall     Comment:  09/30/13, 03/15/15, 06/14/15 No date: Fibromyalgia No date: Hypertension No date: Neuropathy No date: Osteoarthritis No date: Tinnitus   HPI  Patient presents today for an acute visit.  She has been having URI symptoms for the past couple weeks.  She was seen by urgent care and was given a Decadron  injection and a round of azithromycin .  Patient has completed antibiotics and states that she is still coughing especially at night.  She does have some wheezing and congestion.  We will order prednisone , Tessalon  Perles, Phenergan  base cough syrup to take at night.  We discussed deep breathing exercises. Denies f/c/s, n/v/d, hemoptysis, PND, leg swelling Denies chest pain or edema      Allergies  Allergen Reactions   Ultram [Tramadol] Shortness Of Breath   Acetazolamide      Other reaction(s): severe headaches   Benzodiazepines Other (See Comments)    Short term memory loss   Codeine Itching    Can take with benadryl in certain time length   Demerol [Meperidine] Nausea Only   Duloxetine Hcl     Other reaction(s): memory loss short term   Fentanyl     Other reaction(s): itching   Fondaparinux Other (See Comments)    Increased bleeding Other reaction(s): increase bleeding potential   Gabapentin Other (See Comments)    Memory loss   Lyrica [Pregabalin] Other (See Comments)    Memory loss   Morphine And Codeine Hives   Oxycodone-Acetaminophen      Other reaction(s): itching   Pentazocine     Other reaction(s): Itching   Zoloft [Sertraline Hcl]  Other (See Comments)    Memory loss    Immunization History  Administered Date(s) Administered   Fluad Quad(Showers Dose 65+) 03/31/2022   Fluzone Influenza virus vaccine,trivalent (IIV3), split virus 02/19/2013, 04/19/2014, 01/17/2015, 01/21/2016, 02/07/2017, 01/22/2018, 01/30/2019, 01/14/2020   INFLUENZA, Kassabian DOSE SEASONAL PF 02/07/2017   Influenza-Unspecified 02/18/2021   PFIZER Comirnaty(Gray Top)Covid-19 Tri-Sucrose Vaccine 02/28/2022   PFIZER(Purple Top)SARS-COV-2 Vaccination 06/25/2019, 07/20/2019, 03/11/2020   Pneumococcal Conjugate-13 01/27/2014   Pneumococcal Polysaccharide-23 01/28/2013   Tdap 12/02/2017   Zoster Recombinant(Shingrix) 08/01/2021, 11/01/2021    Tobacco History: Social History   Tobacco Use  Smoking Status Former   Types: Cigarettes  Smokeless Tobacco Never   Counseling given: Not Answered   Outpatient Encounter Medications as of 01/23/2024  Medication Sig   Acetaminophen  (TYLENOL  8 HOUR PO) Take by mouth.   albuterol  (VENTOLIN  HFA) 108 (90 Base) MCG/ACT inhaler Inhale 2 puffs into the lungs every 4 (four) hours as needed for wheezing or shortness of breath.   aspirin EC 81 MG tablet Take 81 mg by mouth daily. Swallow whole.   azithromycin  (ZITHROMAX ) 250 MG tablet Take 1 tablet (250 mg total) by mouth daily. Take first 2 tablets together, then 1 every day until finished.   baclofen  (LIORESAL ) 10 MG tablet Take 1 tablet (10 mg total) by mouth 2 (two) times daily.   benzonatate  (TESSALON ) 200 MG capsule Take 1  capsule (200 mg total) by mouth 2 (two) times daily as needed for cough.   Biotin 1000 MCG CHEW Chew by mouth.   CALCIUM PO Take by mouth.   Camphor-Menthol-Methyl Sal (SALONPAS EX) Apply topically.   Carboxymethylcellulose Sodium (DRY EYE RELIEF OP) Apply to eye.   Chlorpheniramine Maleate (ALLERGY PO) Take by mouth.   Cholecalciferol (VITAMIN D3 PO) Take 800 Units by mouth 3 (three) times a week.   Cranberry-Vitamin C-Probiotic (AZO CRANBERRY  PO) Take by mouth.   diclofenac Sodium (VOLTAREN) 1 % GEL Apply topically 4 (four) times daily.   diphenhydrAMINE HCl (BENADRYL ALLERGY PO) Take by mouth.   ezetimibe  (ZETIA ) 10 MG tablet Take 1 tablet (10 mg total) by mouth daily.   guaiFENesin (MUCINEX) 600 MG 12 hr tablet Take by mouth 2 (two) times daily.   ketorolac  (ACULAR ) 0.5 % ophthalmic solution Place 1 drop into the left eye 4 (four) times daily.   lidocaine (LMX) 4 % cream Apply 1 Application topically as needed.   Liniments (SALONPAS PAIN RELIEF PATCH EX) Apply topically daily as needed.   LORazepam  (ATIVAN ) 0.5 MG tablet Take 1 tablet (0.5 mg total) by mouth daily as needed for anxiety.   Magnesium Hydroxide (DULCOLAX PO) Take by mouth.   MAGNESIUM PO Take 200 mg by mouth.   Methen-Hyosc-Meth Blue-Na Phos (UROGESIC-BLUE PO) Take 1 tablet by mouth 4 (four) times daily as needed.   mirtazapine  (REMERON  SOL-TAB) 15 MG disintegrating tablet Take 1 tablet (15 mg total) by mouth at bedtime.   naproxen sodium (ANAPROX) 220 MG tablet Take 220 mg by mouth every 8 (eight) hours as needed. AS NEEDED   ofloxacin (OCUFLOX) 0.3 % ophthalmic solution Place 1 drop into the left eye 4 (four) times daily.   olmesartan -hydrochlorothiazide (BENICAR  HCT) 20-12.5 MG tablet Take 1 tablet by mouth once daily   Omega Fatty Acids-Vitamins (OMEGA-3 GUMMIES PO) Take by mouth.   Polyethyl Glycol-Propyl Glycol (SYSTANE OP) Apply to eye.   prednisoLONE acetate (PRED FORTE) 1 % ophthalmic suspension Place 1 drop into the left eye 4 (four) times daily.   predniSONE  (DELTASONE ) 20 MG tablet Take 1 tablet (20 mg total) by mouth daily with breakfast.   predniSONE  (DELTASONE ) 20 MG tablet Take 1 tablet (20 mg total) by mouth daily with breakfast.   promethazine -dextromethorphan (PROMETHAZINE -DM) 6.25-15 MG/5ML syrup Take 5 mLs by mouth at bedtime as needed for cough.   vitamin E 400 UNIT capsule Take 400-800 Units by mouth. 3-4 times weekly per pt   zinc gluconate  50 MG tablet Take 50 mg by mouth daily.   No facility-administered encounter medications on file as of 01/23/2024.    Review of Systems  Review of Systems  Constitutional: Negative.   HENT: Negative.    Cardiovascular: Negative.   Gastrointestinal: Negative.   Allergic/Immunologic: Negative.   Neurological: Negative.   Psychiatric/Behavioral: Negative.       Objective:   BP 131/81   Pulse 95   Temp 98.2 F (36.8 C) (Oral)   Wt 232 lb (105.2 kg)   SpO2 97%   BMI 33.29 kg/m   Wt Readings from Last 5 Encounters:  01/23/24 232 lb (105.2 kg)  01/03/24 232 lb (105.2 kg)  03/12/23 235 lb (106.6 kg)  04/26/21 234 lb (106.1 kg)  10/24/20 238 lb 0.2 oz (108 kg)     Physical Exam Vitals and nursing note reviewed.  Constitutional:      General: She is not in acute distress.    Appearance:  She is well-developed.  Cardiovascular:     Rate and Rhythm: Normal rate and regular rhythm.  Pulmonary:     Effort: Pulmonary effort is normal.     Breath sounds: Normal breath sounds.  Neurological:     Mental Status: She is alert and oriented to person, place, and time.       Assessment & Plan:   Flu-like symptoms -     POC Covid19/Flu A&B Antigen  Upper respiratory tract infection, unspecified type -     predniSONE ; Take 1 tablet (20 mg total) by mouth daily with breakfast.  Dispense: 5 tablet; Refill: 0  Cough, unspecified type -     Benzonatate ; Take 1 capsule (200 mg total) by mouth 2 (two) times daily as needed for cough.  Dispense: 20 capsule; Refill: 0 -     Promethazine -DM; Take 5 mLs by mouth at bedtime as needed for cough.  Dispense: 118 mL; Refill: 0  Other orders -     Ezetimibe ; Take 1 tablet (10 mg total) by mouth daily.  Dispense: 90 tablet; Refill: 0     Return if symptoms worsen or fail to improve.   Bascom GORMAN Borer, NP 01/23/2024

## 2024-02-10 ENCOUNTER — Other Ambulatory Visit: Payer: Self-pay | Admitting: Nurse Practitioner

## 2024-02-10 MED ORDER — LORAZEPAM 0.5 MG PO TABS: 0.5000 mg | ORAL_TABLET | Freq: Every day | ORAL | 0 refills | Status: DC | PRN

## 2024-02-10 NOTE — Telephone Encounter (Signed)
 Copied from CRM [id number]. Topic: Clinical - Medication Refill >> Feb 10, 2024  1:17 PM Kendralyn S wrote: Medication:  LORazepam  (ATIVAN ) 0.5 MG tablet    Has the patient contacted their pharmacy? No (Agent: If no, request that the patient contact the pharmacy for the refill. If patient does not wish to contact the pharmacy document the reason why and proceed with request.) (Agent: If yes, when and what did the pharmacy advise?)  This is the patient's preferred pharmacy:  [facility] [address] [place] - [address] [place] [address] Phone: [phone] Fax: [phone]  Is this the correct pharmacy for this prescription? Yes If no, delete pharmacy and type the correct one.   Has the prescription been filled recently? No  Is the patient out of the medication? Yes  Has the patient been seen for an appointment in the last year OR does the patient have an upcoming appointment? Yes  Can we respond through MyChart? No  Agent: Please be advised that Rx refills may take up to 3 business days. We ask that you follow-up with your pharmacy.

## 2024-02-10 NOTE — Telephone Encounter (Signed)
 Please advise [facility]

## 2024-03-06 ENCOUNTER — Telehealth: Payer: Self-pay | Admitting: Neurology

## 2024-03-06 NOTE — Telephone Encounter (Signed)
 Patient requesting to schedule an appointment with provider at [facility],. Scheduled patient with Dr. Buck on 07/09/24 at 1:15pm.  Patient said short term memory  has worsened.

## 2024-03-10 ENCOUNTER — Other Ambulatory Visit: Payer: Self-pay | Admitting: Nurse Practitioner

## 2024-03-10 DIAGNOSIS — I10 Essential (primary) hypertension: Secondary | ICD-10-CM

## 2024-03-26 ENCOUNTER — Other Ambulatory Visit: Payer: Self-pay | Admitting: Sports Medicine

## 2024-03-26 DIAGNOSIS — G8929 Other chronic pain: Secondary | ICD-10-CM

## 2024-03-26 DIAGNOSIS — R102 Pelvic and perineal pain unspecified side: Secondary | ICD-10-CM

## 2024-04-02 ENCOUNTER — Ambulatory Visit
Admission: RE | Admit: 2024-04-02 | Discharge: 2024-04-02 | Disposition: A | Discharge status: Home / Self Care | Source: Ambulatory Visit | Attending: Sports Medicine | Admitting: Sports Medicine

## 2024-04-02 DIAGNOSIS — G8929 Other chronic pain: Secondary | ICD-10-CM

## 2024-04-02 DIAGNOSIS — R102 Pelvic and perineal pain unspecified side: Secondary | ICD-10-CM

## 2024-04-27 ENCOUNTER — Other Ambulatory Visit: Payer: Self-pay | Admitting: Nurse Practitioner

## 2024-04-27 ENCOUNTER — Ambulatory Visit: Payer: Self-pay

## 2024-04-27 DIAGNOSIS — G8929 Other chronic pain: Secondary | ICD-10-CM

## 2024-04-27 NOTE — Telephone Encounter (Signed)
 FYI Only or Action Required?: FYI only for provider: will call back for appt-- wants to schedule mid January .  Patient was last seen in primary care on 01/23/2024 by Oley Bascom RAMAN, NP.  Called Nurse Triage reporting Back Pain.  Symptoms began several months ago.  Interventions attempted: OTC medications: advil.  Symptoms are: gradually worsening.  Triage Disposition: See PCP When Office is Open (Within 3 Days)  Patient/caregiver understands and will follow disposition?: Yes  Copied from CRM #[id number]. Topic: Clinical - Red Word Triage >> Apr 27, 2024 12:05 PM Corin V wrote: [facility] that prompted transfer to Nurse Triage: Call back: [phone] Patient is currently having worsening pain in her back when laying down. This new pain started about a week ago, but the sharp pain never occurred when laying down before.  Patient called in stating she had a Lumbar Spine MRI about 6 years ago and was told she had 3 dried out discs of lumbar region that cause pain in left hip. Since the beginning fo the year she has had pain in her  perineal area in vagina and under buttocks when sitting. She has been sitting on 2 memory foam pillows. She had an orthopedic provider order MRI inn  [place] and then her gynecologist orded MRI of abdomen and pelvis in 7/17. That did not address pain in the bone and the radiologist told her that they should have ordered an MRI of bony pelvis.  She is also concerned about the Brain MRI and if she needs to have more cognitive testing due to the neruologist leaving and a new provider not seeing her until February. She also wants to have testing for possible ovarian cancer/carcinoma screening due to family history and growth on adrenal gland.  Patient did not want to wait on hold and requested a call back from NT when able to speak to her. Reason for Disposition  [1] MODERATE back pain (e.g., interferes with normal activities) AND [2] present > 3 days  Answer Assessment -  Initial Assessment Questions Osteoarthritis Back pain for a year or two, worsened for the last week  Hip/pelvic pain- cannot sit- now it is when she is lying down as well - sharp pain to her lady parts with no inciting incident.  ORTHO- MRI of Left hip- instead of full pelvis  Family history of ovarian cancer- only has Left ovary left Bone and nerve related.  She is unsure when she would be able to make it into the office. Advised to call back in the morning   1. ONSET: When did the pain begin? (e.g., minutes, hours, days)     Months ago  2. LOCATION: Where does it hurt? (upper, mid or lower back)     Back pain/pelvic/hip pain 3. SEVERITY: How bad is the pain?  (e.g., Scale 1-10; mild, moderate, or severe)     4-5/10 4. PATTERN: Is the pain constant? (e.g., yes, no; constant, intermittent)      Intermittent  5. RADIATION: Does the pain shoot into your legs or somewhere else?     To her pelvis  6. CAUSE:  What do you think is causing the back pain?      Has hx of bad back  7. BACK OVERUSE:  Any recent lifting of heavy objects, strenuous work or exercise?     denies 8. MEDICINES: What have you taken so far for the pain? (e.g., nothing, acetaminophen , NSAIDS)     Advil, tylenol  9. NEUROLOGIC SYMPTOMS: Do you  have any weakness, numbness, or problems with bowel/bladder control?     Sharp pains to her groin  10. OTHER SYMPTOMS: Do you have any other symptoms? (e.g., fever, abdomen pain, burning with urination, blood in urine)       Denies  Protocols used: Back Pain-A-AH

## 2024-04-28 NOTE — Telephone Encounter (Signed)
 fyi

## 2024-04-28 NOTE — Telephone Encounter (Signed)
 Please advise [facility]

## 2024-06-09 ENCOUNTER — Other Ambulatory Visit: Payer: Self-pay | Admitting: Nurse Practitioner

## 2024-06-09 DIAGNOSIS — I10 Essential (primary) hypertension: Secondary | ICD-10-CM

## 2024-06-17 ENCOUNTER — Other Ambulatory Visit: Payer: Self-pay | Admitting: Nurse Practitioner

## 2024-06-17 DIAGNOSIS — G8929 Other chronic pain: Secondary | ICD-10-CM

## 2024-06-17 NOTE — Telephone Encounter (Signed)
 Patient called to follow up on her medication refills. Please call the patient back directly asap while she is still getting set up for transportation to confirm that the medication is sent.

## 2024-06-17 NOTE — Telephone Encounter (Signed)
 Please Advise.  CB.

## 2024-06-17 NOTE — Telephone Encounter (Signed)
 baclofen  (LIORESAL ) 10 MG tablet [Pharmacy Med Name: Baclofen  10 MG Oral Tablet]

## 2024-07-06 ENCOUNTER — Ambulatory Visit: Payer: Self-pay | Admitting: Nurse Practitioner

## 2024-07-09 ENCOUNTER — Ambulatory Visit: Admitting: Neurology
